# Patient Record
Sex: Female | Born: 1985 | Race: Black or African American | Hispanic: No | Marital: Single | Smoking: Current every day smoker
Health system: Southern US, Community
[De-identification: ages and names within clinical notes are randomized; demographics above are authoritative.]

## PROBLEM LIST (undated history)

## (undated) HISTORY — PX: ABDOMINAL SURGERY: SHX537

---

## 2013-01-26 ENCOUNTER — Encounter (HOSPITAL_BASED_OUTPATIENT_CLINIC_OR_DEPARTMENT_OTHER): Payer: Self-pay

## 2013-01-26 ENCOUNTER — Emergency Department (HOSPITAL_BASED_OUTPATIENT_CLINIC_OR_DEPARTMENT_OTHER)
Admission: EM | Admit: 2013-01-26 | Discharge: 2013-01-27 | Disposition: A | Discharge status: Home / Self Care | Payer: Medicaid Other | Attending: Emergency Medicine | Admitting: Emergency Medicine

## 2013-01-26 DIAGNOSIS — K0520 Aggressive periodontitis, unspecified: Secondary | ICD-10-CM | POA: Insufficient documentation

## 2013-01-26 DIAGNOSIS — F17200 Nicotine dependence, unspecified, uncomplicated: Secondary | ICD-10-CM | POA: Insufficient documentation

## 2013-01-26 DIAGNOSIS — K05219 Aggressive periodontitis, localized, unspecified severity: Secondary | ICD-10-CM

## 2013-01-26 DIAGNOSIS — K0889 Other specified disorders of teeth and supporting structures: Secondary | ICD-10-CM

## 2013-01-26 DIAGNOSIS — K089 Disorder of teeth and supporting structures, unspecified: Secondary | ICD-10-CM | POA: Insufficient documentation

## 2013-01-26 MED ORDER — HYDROCODONE-ACETAMINOPHEN 5-325 MG PO TABS: 2.0000 | ORAL_TABLET | Freq: Four times a day (QID) | ORAL | Status: DC | PRN

## 2013-01-26 MED ORDER — HYDROCODONE-ACETAMINOPHEN 5-325 MG PO TABS
2.0000 | ORAL_TABLET | Freq: Once | ORAL | Status: AC
  Administered 2013-01-26: 2 via ORAL
  Filled 2013-01-26: qty 2

## 2013-01-26 MED ORDER — PENICILLIN V POTASSIUM 500 MG PO TABS: 500.0000 mg | ORAL_TABLET | Freq: Four times a day (QID) | ORAL | Status: DC

## 2013-01-26 MED ORDER — BUPIVACAINE-EPINEPHRINE PF 0.5-1:200000 % IJ SOLN
1.8000 mL | Freq: Once | INTRAMUSCULAR | Status: DC
  Filled 2013-01-26 (×2): qty 1.8

## 2013-01-26 NOTE — ED Notes (Signed)
Patient reports that she developed dental pain 1 hour pta. Swelling to right jaw and right lower gum.

## 2013-01-26 NOTE — ED Provider Notes (Signed)
History     CSN: [id number]  Arrival date & time 01/26/13  2309   First MD Initiated Contact with Patient 01/26/13 2322      Chief Complaint  Patient presents with  . Dental Pain    (Consider location/radiation/quality/duration/timing/severity/associated sxs/prior treatment) HPI Comments: Patient presents to the emergency department with a dental complaint. Symptoms began 1 hour ago. The patient has tried to alleviate pain with nothing.  Pain rated at a 10/10, characterized as throbbing in nature and located right lower molar. Patient denies fever, night sweats, chills, difficulty swallowing or opening mouth, SOB, nuchal rigidity or decreased ROM of neck.  Patient does not have a dentist and requests a resource guide at discharge.   The history is provided by the patient. No language interpreter was used.    History reviewed. No pertinent past medical history.  History reviewed. No pertinent past surgical history.  No family history on file.  History  Substance Use Topics  . Smoking status: Current Every Day Smoker -- 0.50 packs/day    Types: Cigarettes  . Smokeless tobacco: Not on file  . Alcohol Use: No    OB History   Grav Para Term Preterm Abortions TAB SAB Ect Mult Living                  Review of Systems  All other systems reviewed and are negative.    Allergies  Review of patient's allergies indicates no known allergies.  Home Medications   Current Outpatient Rx  Name  Route  Sig  Dispense  Refill  . HYDROcodone-acetaminophen (NORCO/VICODIN) 5-325 MG per tablet   Oral   Take 2 tablets by mouth every 6 (six) hours as needed for pain.   15 tablet   0   . penicillin v potassium (VEETID) 500 MG tablet   Oral   Take 1 tablet (500 mg total) by mouth 4 (four) times daily.   40 tablet   0     BP 124/84  Temp(Src) 98 F (36.7 C) (Oral)  Ht 5\' 7"  (1.702 m)  Wt 162 lb (73.483 kg)  BMI 25.37 kg/m2  SpO2 98%  Physical Exam  Nursing note and vitals  reviewed. Constitutional: She is oriented to person, place, and time. She appears well-developed and well-nourished.  HENT:  Head: Normocephalic and atraumatic.  Mouth/Throat:    Poor dentition throughout.  Affected tooth as diagrammed.  No signs of peritonsillar or tonsillar abscess.  Oropharynx is clear and without exudates.  Uvula is midline.  Airway is intact. No signs of Ludwig's angina.  1x2cm gingival abscess   Eyes: Conjunctivae and EOM are normal.  Neck: Normal range of motion.  Cardiovascular: Normal rate.   Pulmonary/Chest: Effort normal.  Abdominal: She exhibits no distension.  Musculoskeletal: Normal range of motion.  Neurological: She is alert and oriented to person, place, and time.  Skin: Skin is dry.  Psychiatric: She has a normal mood and affect. Her behavior is normal. Judgment and thought content normal.    ED Course  Procedures (including critical care time)  Labs Reviewed - No data to display No results found. INCISION AND DRAINAGE Performed by: Roxy Horseman Consent: Verbal consent obtained. Risks and benefits: risks, benefits and alternatives were discussed Type: abscess  Body area: right lower gingiva  Anesthesia: local infiltration  Incision was made with a scalpel.  Local anesthetic: marcaine  Anesthetic total: 1.8 ml  Complexity: complex Blunt dissection to break up loculations  Drainage: purulent  Drainage amount:  3ml  Packing material: none  Patient tolerance: Patient tolerated the procedure well with no immediate complications.     1. Gingival abscess   2. Pain, dental       MDM  Patient with dental pain and gingival abscess. Gingival abscess was drained while performing dental block. Will discharge the patient with pain medicine and penicillin. No other complications. Dental followup. Patient is stable and ready for discharge.        Roxy Horseman, PA-C 01/26/13 [phone]

## 2013-01-27 NOTE — ED Provider Notes (Signed)
Medical screening examination/treatment/procedure(s) were performed by non-physician practitioner and as supervising physician I was immediately available for consultation/collaboration.   Carlisle Beers Nareg Breighner, MD 01/27/13 0700

## 2020-01-27 ENCOUNTER — Other Ambulatory Visit: Payer: Self-pay

## 2020-01-27 ENCOUNTER — Encounter (HOSPITAL_BASED_OUTPATIENT_CLINIC_OR_DEPARTMENT_OTHER): Payer: Self-pay | Admitting: Emergency Medicine

## 2020-01-27 ENCOUNTER — Emergency Department (HOSPITAL_BASED_OUTPATIENT_CLINIC_OR_DEPARTMENT_OTHER)
Admission: EM | Admit: 2020-01-27 | Discharge: 2020-01-27 | Disposition: A | Discharge status: Home / Self Care | Payer: Medicaid Other | Attending: Emergency Medicine | Admitting: Emergency Medicine

## 2020-01-27 DIAGNOSIS — F17210 Nicotine dependence, cigarettes, uncomplicated: Secondary | ICD-10-CM | POA: Insufficient documentation

## 2020-01-27 DIAGNOSIS — A599 Trichomoniasis, unspecified: Secondary | ICD-10-CM | POA: Diagnosis not present

## 2020-01-27 DIAGNOSIS — M545 Low back pain, unspecified: Secondary | ICD-10-CM

## 2020-01-27 LAB — URINALYSIS, ROUTINE W REFLEX MICROSCOPIC
Bilirubin Urine: NEGATIVE
Glucose, UA: NEGATIVE mg/dL
Ketones, ur: NEGATIVE mg/dL
Nitrite: NEGATIVE
Protein, ur: NEGATIVE mg/dL
Specific Gravity, Urine: 1.02 (ref 1.005–1.030)
pH: 6 (ref 5.0–8.0)

## 2020-01-27 LAB — URINALYSIS, MICROSCOPIC (REFLEX)

## 2020-01-27 LAB — PREGNANCY, URINE: Preg Test, Ur: NEGATIVE

## 2020-01-27 MED ORDER — IBUPROFEN 800 MG PO TABS: 800.0000 mg | ORAL_TABLET | Freq: Three times a day (TID) | ORAL | 0 refills | Status: DC | PRN

## 2020-01-27 MED ORDER — METHOCARBAMOL 500 MG PO TABS: 500.0000 mg | ORAL_TABLET | Freq: Three times a day (TID) | ORAL | 0 refills | Status: DC | PRN

## 2020-01-27 MED ORDER — METRONIDAZOLE 500 MG PO TABS: 500.0000 mg | ORAL_TABLET | Freq: Two times a day (BID) | ORAL | 0 refills | Status: DC

## 2020-01-27 MED FILL — IBUPROFEN 800 MG TAB: 800 | 7 days supply | Qty: 21 | Fill #0

## 2020-01-27 MED FILL — METHOCARBAMOL 500 MG TABS: 500 | 5 days supply | Qty: 15 | Fill #0

## 2020-01-27 MED FILL — METRONIDAZOLE 500 MG TABS: 500 | 7 days supply | Qty: 14 | Fill #0

## 2020-01-27 NOTE — Discharge Instructions (Addendum)
You are seen in the emergency department for low back pain.  You had a urinalysis and pregnancy test that were unremarkable.  We are prescribing you ibuprofen and a muscle relaxant to help with your symptoms.  You can also try heating pad to the area.  Please follow-up with your doctor for further evaluation.  Return to the emergency department for any worsening or concerning symptoms

## 2020-01-27 NOTE — ED Provider Notes (Signed)
[facility] EMERGENCY DEPARTMENT Provider Note   CSN: [id number] Arrival date & time: 01/27/20  9509     History Chief Complaint  Patient presents with  . Back Pain    Tara Salazar is a 34 y.o. female.  She is complaining of low back pain that started last night.  It is across to her belt line on both sides.  No radiation down the legs or into the abdomen.  No urinary symptoms.  No numbness or weakness.  Denies any trauma.  Took some Tylenol without any improvement.  The history is provided by the patient.  Back Pain Location:  Lumbar spine Quality:  Aching Radiates to:  Does not radiate Pain severity:  Moderate Pain is:  Unable to specify Onset quality:  Gradual Duration:  2 days Timing:  Constant Progression:  Unchanged Chronicity:  New Context: not falling and not recent injury   Relieved by:  None tried Worsened by:  Bending and twisting Ineffective treatments:  OTC medications Associated symptoms: no abdominal pain, no bladder incontinence, no bowel incontinence, no chest pain, no dysuria, no fever, no leg pain, no numbness, no paresthesias and no weakness   Risk factors: no hx of cancer, no recent surgery, no steroid use and no vascular disease        History reviewed. No pertinent past medical history.  There are no problems to display for this patient.   Past Surgical History:  Procedure Laterality Date  . CESAREAN SECTION       OB History   No obstetric history on file.     History reviewed. No pertinent family history.  Social History   Tobacco Use  . Smoking status: Current Every Day Smoker    Packs/day: 0.50    Types: Cigarettes  Substance Use Topics  . Alcohol use: Not Currently  . Drug use: Never    Home Medications Prior to Admission medications   Medication Sig Start Date End Date Taking? Authorizing Provider  HYDROcodone-acetaminophen (NORCO/VICODIN) 5-325 MG per tablet Take 2 tablets by mouth every 6 (six) hours as  needed for pain. 01/26/13   Roxy Horseman, PA-C  penicillin v potassium (VEETID) 500 MG tablet Take 1 tablet (500 mg total) by mouth 4 (four) times daily. 01/26/13   Roxy Horseman, PA-C    Allergies    Patient has no known allergies.  Review of Systems   Review of Systems  Constitutional: Negative for fever.  HENT: Negative for sore throat.   Eyes: Negative for visual disturbance.  Respiratory: Negative for shortness of breath.   Cardiovascular: Negative for chest pain.  Gastrointestinal: Negative for abdominal pain and bowel incontinence.  Genitourinary: Negative for bladder incontinence and dysuria.  Musculoskeletal: Positive for back pain.  Skin: Negative for rash.  Neurological: Negative for weakness, numbness and paresthesias.    Physical Exam Updated Vital Signs BP 130/80 (BP Location: Right Arm)   Pulse 65   Temp 98.6 F (37 C) (Oral)   Resp 16   Ht 5\' 7"  (1.702 m)   Wt 102.1 kg   LMP 12/18/2019 (Approximate)   SpO2 100%   BMI 35.24 kg/m   Physical Exam Vitals and nursing note reviewed.  Constitutional:      General: She is not in acute distress.    Appearance: She is well-developed.  HENT:     Head: Normocephalic and atraumatic.  Eyes:     Conjunctiva/sclera: Conjunctivae normal.  Cardiovascular:     Rate and Rhythm: Normal rate and regular  rhythm.     Heart sounds: No murmur.  Pulmonary:     Effort: Pulmonary effort is normal. No respiratory distress.     Breath sounds: Normal breath sounds.  Abdominal:     Palpations: Abdomen is soft.     Tenderness: There is no abdominal tenderness.  Musculoskeletal:        General: Tenderness present. No deformity. Normal range of motion.     Cervical back: Neck supple.     Comments: She is nontender cervical and thoracic spine.  There are some vague lumbar tenderness but more so paralumbar.  Increased pain with twisting and bending.  Skin:    General: Skin is warm and dry.     Capillary Refill: Capillary  refill takes less than 2 seconds.  Neurological:     General: No focal deficit present.     Mental Status: She is alert.     Sensory: No sensory deficit.     Motor: No weakness.     Gait: Gait normal.     ED Results / Procedures / Treatments   Labs (all labs ordered are listed, but only abnormal results are displayed) Labs Reviewed  URINALYSIS, ROUTINE W REFLEX MICROSCOPIC - Abnormal; Notable for the following components:      Result Value   APPearance CLOUDY (*)    Hgb urine dipstick TRACE (*)    Leukocytes,Ua SMALL (*)    All other components within normal limits  URINALYSIS, MICROSCOPIC (REFLEX) - Abnormal; Notable for the following components:   Bacteria, UA FEW (*)    Trichomonas, UA PRESENT (*)    All other components within normal limits  PREGNANCY, URINE    EKG None  Radiology No results found.  Procedures Procedures (including critical care time)  Medications Ordered in ED Medications - No data to display  ED Course  I have reviewed the triage vital signs and the nursing notes.  Pertinent labs & imaging results that were available during my care of the patient were reviewed by me and considered in my medical decision making (see chart for details).  Clinical Course as of Jan 26 1746  Tue Jan 27, 2020  1021 Differential diagnosis includes musculoskeletal pain, radiculopathy, spinal stenosis, UTI, renal colic   [MB]    Clinical Course User Index [MB] Terrilee Files, MD   MDM Rules/Calculators/A&P                     34 year old female here with 1 day of low back pain.  No red flags.  Normal neuro exam.  Differential includes musculoskeletal, fracture, UTI, vascular.  Benign exam and normal vitals.  Urinalysis here does not look particularly infected but does show signs of trichomonas.  Reviewed with patient.  Will put on antibiotics and treat for musculoskeletal back pain.  Return instructions discussed.  Final Clinical Impression(s) / ED  Diagnoses Final diagnoses:  Acute bilateral low back pain without sciatica  Trichimoniasis    Rx / DC Orders ED Discharge Orders         Ordered    ibuprofen (ADVIL) 800 MG tablet  Every 8 hours PRN,   Status:  Discontinued     01/27/20 1035    methocarbamol (ROBAXIN) 500 MG tablet  Every 8 hours PRN     01/27/20 1035    ibuprofen (ADVIL) 800 MG tablet  Every 8 hours PRN     01/27/20 1036    metroNIDAZOLE (FLAGYL) 500 MG tablet  2 times daily  01/27/20 1130           Hayden Rasmussen, MD 01/27/20 [phone]

## 2020-01-27 NOTE — ED Triage Notes (Signed)
Pt c/o lower back pain that pt reports started last night. Pt denies dysuria, denies injury. Pt reports numbness in left leg last night, denies numbness or tingling at this time. LMP 12/18/2019

## 2020-11-02 ENCOUNTER — Other Ambulatory Visit: Payer: Self-pay

## 2020-11-02 ENCOUNTER — Encounter (HOSPITAL_BASED_OUTPATIENT_CLINIC_OR_DEPARTMENT_OTHER): Payer: Self-pay

## 2020-11-02 ENCOUNTER — Emergency Department (HOSPITAL_BASED_OUTPATIENT_CLINIC_OR_DEPARTMENT_OTHER)
Admission: EM | Admit: 2020-11-02 | Discharge: 2020-11-02 | Disposition: A | Discharge status: Home / Self Care | Payer: Medicaid Other | Attending: Emergency Medicine | Admitting: Emergency Medicine

## 2020-11-02 DIAGNOSIS — J111 Influenza due to unidentified influenza virus with other respiratory manifestations: Secondary | ICD-10-CM

## 2020-11-02 DIAGNOSIS — R519 Headache, unspecified: Secondary | ICD-10-CM | POA: Diagnosis present

## 2020-11-02 DIAGNOSIS — M5441 Lumbago with sciatica, right side: Secondary | ICD-10-CM | POA: Diagnosis not present

## 2020-11-02 DIAGNOSIS — U071 COVID-19: Secondary | ICD-10-CM | POA: Diagnosis not present

## 2020-11-02 DIAGNOSIS — F17210 Nicotine dependence, cigarettes, uncomplicated: Secondary | ICD-10-CM | POA: Insufficient documentation

## 2020-11-02 DIAGNOSIS — M5442 Lumbago with sciatica, left side: Secondary | ICD-10-CM | POA: Insufficient documentation

## 2020-11-02 LAB — URINALYSIS, ROUTINE W REFLEX MICROSCOPIC
Bilirubin Urine: NEGATIVE
Glucose, UA: NEGATIVE mg/dL
Ketones, ur: NEGATIVE mg/dL
Nitrite: NEGATIVE
Protein, ur: NEGATIVE mg/dL
Specific Gravity, Urine: 1.005 (ref 1.005–1.030)
pH: 7 (ref 5.0–8.0)

## 2020-11-02 LAB — COMPREHENSIVE METABOLIC PANEL
ALT: 17 U/L (ref 0–44)
AST: 17 U/L (ref 15–41)
Albumin: 4 g/dL (ref 3.5–5.0)
Alkaline Phosphatase: 84 U/L (ref 38–126)
Anion gap: 10 (ref 5–15)
BUN: 11 mg/dL (ref 6–20)
CO2: 23 mmol/L (ref 22–32)
Calcium: 9.2 mg/dL (ref 8.9–10.3)
Chloride: 103 mmol/L (ref 98–111)
Creatinine, Ser: 1.22 mg/dL — ABNORMAL HIGH (ref 0.44–1.00)
GFR, Estimated: 60 mL/min — ABNORMAL LOW (ref >60)
Glucose, Bld: 100 mg/dL — ABNORMAL HIGH (ref 70–99)
Potassium: 3.5 mmol/L (ref 3.5–5.1)
Sodium: 136 mmol/L (ref 135–145)
Total Bilirubin: 0.8 mg/dL (ref 0.3–1.2)
Total Protein: 8.5 g/dL — ABNORMAL HIGH (ref 6.5–8.1)

## 2020-11-02 LAB — CBC WITH DIFFERENTIAL/PLATELET
Abs Immature Granulocytes: 0.03 10*3/uL (ref 0.00–0.07)
Basophils Absolute: 0 10*3/uL (ref 0.0–0.1)
Basophils Relative: 0 %
Eosinophils Absolute: 0 10*3/uL (ref 0.0–0.5)
Eosinophils Relative: 0 %
HCT: 33.7 % — ABNORMAL LOW (ref 36.0–46.0)
Hemoglobin: 11.4 g/dL — ABNORMAL LOW (ref 12.0–15.0)
Immature Granulocytes: 0 %
Lymphocytes Relative: 30 %
Lymphs Abs: 3.3 10*3/uL (ref 0.7–4.0)
MCH: 28.6 pg (ref 26.0–34.0)
MCHC: 33.8 g/dL (ref 30.0–36.0)
MCV: 84.7 fL (ref 80.0–100.0)
Monocytes Absolute: 0.7 10*3/uL (ref 0.1–1.0)
Monocytes Relative: 6 %
Neutro Abs: 7.1 10*3/uL (ref 1.7–7.7)
Neutrophils Relative %: 64 %
Platelets: 338 10*3/uL (ref 150–400)
RBC: 3.98 MIL/uL (ref 3.87–5.11)
RDW: 14.5 % (ref 11.5–15.5)
WBC: 11.1 10*3/uL — ABNORMAL HIGH (ref 4.0–10.5)
nRBC: 0 % (ref 0.0–0.2)

## 2020-11-02 LAB — SARS CORONAVIRUS 2 (TAT 6-24 HRS): SARS Coronavirus 2: POSITIVE — AB

## 2020-11-02 LAB — PREGNANCY, URINE: Preg Test, Ur: NEGATIVE

## 2020-11-02 LAB — URINALYSIS, MICROSCOPIC (REFLEX)

## 2020-11-02 MED ORDER — ACETAMINOPHEN 500 MG PO TABS
1000.0000 mg | ORAL_TABLET | Freq: Once | ORAL | Status: AC
  Administered 2020-11-02: 1000 mg via ORAL
  Filled 2020-11-02: qty 2

## 2020-11-02 MED ORDER — KETOROLAC TROMETHAMINE 30 MG/ML IJ SOLN
60.0000 mg | Freq: Once | INTRAMUSCULAR | Status: AC
  Administered 2020-11-02: 60 mg via INTRAMUSCULAR
  Filled 2020-11-02: qty 2

## 2020-11-02 NOTE — ED Triage Notes (Signed)
Pt arrives with c/o body aches, fever, and chills starting yesterday. Pt is not vaccinated, denies any GI or respiratory symptoms at this time.

## 2020-11-02 NOTE — ED Provider Notes (Signed)
[facility] EMERGENCY DEPARTMENT Provider Note   CSN: [id number] Arrival date & time: 11/02/20  1018     History Chief Complaint  Patient presents with   Generalized Body Aches    Tara Salazar is a 35 y.o. female.   Patient developed a headache yesterday followed by back and leg pain.  She has a "sharp pain" going down her left leg.  It has become worse since that time.  Now both legs hurt.  She has had feelings of chills but no fever.  No nausea/vomiting/diarrhea or abdominal pain.  No cough no dizziness.  She rates the headache as a 10/10.  She has not been near any sick contacts.  No Covid exposure.  She is currently unvaccinated against Covid or flu.  She denies dysuria.  She took Tylenol but this did not help her headache.  She has not had any falls or injury.  She does have a history of musculoskeletal leg and back pain.        History reviewed. No pertinent past medical history.  There are no problems to display for this patient.   Past Surgical History:  Procedure Laterality Date   CESAREAN SECTION       OB History   No obstetric history on file.     No family history on file.  Social History   Tobacco Use   Smoking status: Current Every Day Smoker    Packs/day: 0.50    Types: Cigarettes   Smokeless tobacco: Never Used  Substance Use Topics   Alcohol use: Not Currently   Drug use: Never    Home Medications Prior to Admission medications   Medication Sig Start Date End Date Taking? Authorizing Provider  HYDROcodone-acetaminophen (NORCO/VICODIN) 5-325 MG per tablet Take 2 tablets by mouth every 6 (six) hours as needed for pain. 01/26/13   Roxy Horseman, PA-C  ibuprofen (ADVIL) 800 MG tablet Take 1 tablet (800 mg total) by mouth every 8 (eight) hours as needed. 01/27/20   Terrilee Files, MD  methocarbamol (ROBAXIN) 500 MG tablet Take 1 tablet (500 mg total) by mouth every 8 (eight) hours as needed for muscle spasms. 01/27/20   Terrilee Files, MD  metroNIDAZOLE (FLAGYL) 500 MG tablet Take 1 tablet (500 mg total) by mouth 2 (two) times daily. 01/27/20   Terrilee Files, MD  molnupiravir EUA 200 mg CAPS Take 4 capsules (800 mg total) by mouth 2 (two) times daily for 5 days. 11/03/20 11/08/20  Blanchard Kelch, NP  penicillin v potassium (VEETID) 500 MG tablet Take 1 tablet (500 mg total) by mouth 4 (four) times daily. 01/26/13   Roxy Horseman, PA-C    Allergies    Patient has no known allergies.  Review of Systems   Review of Systems  All other systems reviewed and are negative.   Physical Exam Updated Vital Signs BP 112/63 (BP Location: Right Arm)    Pulse 89    Temp 99.4 F (37.4 C) (Oral)    Resp 16    Ht 5\' 7"  (1.702 m)    Wt 72.6 kg    SpO2 99%    BMI 25.06 kg/m   Physical Exam Vitals reviewed.  Constitutional:      General: She is in acute distress.  HENT:     Head: Normocephalic.     Nose: Nose normal. No congestion.     Mouth/Throat:     Mouth: Mucous membranes are moist.     Pharynx: Oropharynx  is clear.  Eyes:     Conjunctiva/sclera: Conjunctivae normal.     Pupils: Pupils are equal, round, and reactive to light.  Cardiovascular:     Rate and Rhythm: Normal rate and regular rhythm.     Pulses: Normal pulses.     Heart sounds: Normal heart sounds. No murmur heard.   Pulmonary:     Effort: Pulmonary effort is normal. No respiratory distress.     Breath sounds: Normal breath sounds. No wheezing.  Abdominal:     General: Abdomen is flat.     Palpations: Abdomen is soft.  Musculoskeletal:        General: Tenderness present. No swelling.     Cervical back: No rigidity or tenderness.     Comments: Positive straight leg test on the left side.  Neurological:     Mental Status: She is alert.     ED Results / Procedures / Treatments   Labs (all labs ordered are listed, but only abnormal results are displayed) Labs Reviewed  SARS CORONAVIRUS 2 (TAT 6-24 HRS) - Abnormal; Notable for the  following components:      Result Value   SARS Coronavirus 2 POSITIVE (*)    All other components within normal limits  COMPREHENSIVE METABOLIC PANEL - Abnormal; Notable for the following components:   Glucose, Bld 100 (*)    Creatinine, Ser 1.22 (*)    Total Protein 8.5 (*)    GFR, Estimated 60 (*)    All other components within normal limits  CBC WITH DIFFERENTIAL/PLATELET - Abnormal; Notable for the following components:   WBC 11.1 (*)    Hemoglobin 11.4 (*)    HCT 33.7 (*)    All other components within normal limits  URINALYSIS, ROUTINE W REFLEX MICROSCOPIC - Abnormal; Notable for the following components:   Hgb urine dipstick SMALL (*)    Leukocytes,Ua TRACE (*)    All other components within normal limits  URINALYSIS, MICROSCOPIC (REFLEX) - Abnormal; Notable for the following components:   Bacteria, UA RARE (*)    All other components within normal limits  PREGNANCY, URINE    EKG None  Radiology No results found.  Procedures Procedures  Medications Ordered in ED Medications  acetaminophen (TYLENOL) tablet 1,000 mg (1,000 mg Oral Given 11/02/20 1143)  ketorolac (TORADOL) 30 MG/ML injection 60 mg (60 mg Intramuscular Given 11/02/20 1233)    ED Course  I have reviewed the triage vital signs and the nursing notes.  Pertinent labs & imaging results that were available during my care of the patient were reviewed by me and considered in my medical decision making (see chart for details).    MDM Rules/Calculators/A&P                          Patient presents with headache and back/ leg pain consistent with sciatica.  She has had a ED visit within the past year for low back pain.  Headache/chills more consistent with a possible viral infection such as Covid.  This may also be contributing to her low back and leg pain.  White count was very mildly elevated and CMP showed only mildly elevated creatinine.  Gave patient a shot of Toradol.  Advised her to use heat, Tylenol and  Aleve as needed for pain.  Advised her to follow-up with PCP.  Covid test obtained and pending. Final Clinical Impression(s) / ED Diagnoses Final diagnoses:  Influenza-like illness  Acute left-sided low back pain with  bilateral sciatica    Rx / DC Orders ED Discharge Orders    None       Sandre Kitty, MD 11/03/20 1733    Melene Plan, DO 11/04/20 [phone]

## 2020-11-02 NOTE — ED Notes (Signed)
Sharp pain in back and left leg started yesterday denies dysuria

## 2020-11-02 NOTE — Discharge Instructions (Addendum)
Your leg pain is likely related to your back pain and a pinched nerve.  This is called sciatica.  You should follow up with your primary care doctor regarding this.  You can also take tylenol 1000mg  three times a day and alleve twice a day.    You are also showing signs of a viral infection which may be due to covid. We will follow up with the results of the covid test when we get them.

## 2020-11-03 ENCOUNTER — Telehealth: Payer: Self-pay | Admitting: Infectious Diseases

## 2020-11-03 ENCOUNTER — Other Ambulatory Visit (HOSPITAL_COMMUNITY): Payer: Self-pay | Admitting: Infectious Diseases

## 2020-11-03 MED ORDER — MOLNUPIRAVIR EUA 200MG CAPSULE: 4.0000 | ORAL_CAPSULE | Freq: Two times a day (BID) | ORAL | 0 refills | Status: AC

## 2020-11-03 MED FILL — MOLNUPIRAVIR 200 MG CAPS: 200 | 5 days supply | Qty: 40 | Fill #0

## 2020-11-03 NOTE — Telephone Encounter (Signed)
Outpatient Oral COVID Treatment Note  I connected with Tara Salazar on 11/03/2020/11:32 AM by telephone and verified that I am speaking with the correct person using two identifiers.  I discussed the limitations, risks, security, and privacy concerns of performing an evaluation and management service by telephone and the availability of in person appointments. I also discussed with the patient that there may be a patient responsible charge related to this service. The patient expressed understanding and agreed to proceed.  Patient location: [place] [facility]  Provider location: [facility]   Diagnosis: COVID-19 infection  Purpose of visit: Discussion of potential use of Molnupiravir or Paxlovid, a new treatment for mild to moderate COVID-19 viral infection in non-hospitalized patients.   Subjective: Patient is a 35 y.o. female who has been diagnosed with COVID 19 viral infection.  Their symptoms began on 11/01/2020 with body aches, URI sx, back pain .    PMHx: overweight, BMI 25, unvaccinated for COVID   No Known Allergies   Current Outpatient Medications:  .  HYDROcodone-acetaminophen (NORCO/VICODIN) 5-325 MG per tablet, Take 2 tablets by mouth every 6 (six) hours as needed for pain., Disp: 15 tablet, Rfl: 0 .  ibuprofen (ADVIL) 800 MG tablet, Take 1 tablet (800 mg total) by mouth every 8 (eight) hours as needed., Disp: 21 tablet, Rfl: 0 .  methocarbamol (ROBAXIN) 500 MG tablet, Take 1 tablet (500 mg total) by mouth every 8 (eight) hours as needed for muscle spasms., Disp: 15 tablet, Rfl: 0 .  metroNIDAZOLE (FLAGYL) 500 MG tablet, Take 1 tablet (500 mg total) by mouth 2 (two) times daily., Disp: 14 tablet, Rfl: 0 .  penicillin v potassium (VEETID) 500 MG tablet, Take 1 tablet (500 mg total) by mouth 4 (four) times daily., Disp: 40 tablet, Rfl: 0  Objective: Patient appears/sounds ill and uncomfortable.  They are in no apparent distress.  Breathing is non labored.  Mood and behavior are normal.    Laboratory Data:  Recent Results (from the past 2160 hour(s))  Comprehensive metabolic panel     Status: Abnormal   Collection Time: 11/02/20 11:13 AM  Result Value Ref Range   Sodium 136 135 - 145 mmol/L   Potassium 3.5 3.5 - 5.1 mmol/L   Chloride 103 98 - 111 mmol/L   CO2 23 22 - 32 mmol/L   Glucose, Bld 100 (H) 70 - 99 mg/dL    Comment: Glucose reference range applies only to samples taken after fasting for at least 8 hours.   BUN 11 6 - 20 mg/dL   Creatinine, Ser 5.85 (H) 0.44 - 1.00 mg/dL   Calcium 9.2 8.9 - 27.7 mg/dL   Total Protein 8.5 (H) 6.5 - 8.1 g/dL   Albumin 4.0 3.5 - 5.0 g/dL   AST 17 15 - 41 U/L   ALT 17 0 - 44 U/L   Alkaline Phosphatase 84 38 - 126 U/L   Total Bilirubin 0.8 0.3 - 1.2 mg/dL   GFR, Estimated 60 (L) >60 mL/min    Comment: (NOTE) Calculated using the CKD-EPI Creatinine Equation (2021)    Anion gap 10 5 - 15    Comment: Performed at [facility], [address] [place], [place] [address]  CBC with Differential     Status: Abnormal   Collection Time: 11/02/20 11:13 AM  Result Value Ref Range   WBC 11.1 (H) 4.0 - 10.5 K/uL   RBC 3.98 3.87 - 5.11 MIL/uL   Hemoglobin 11.4 (L) 12.0 - 15.0 g/dL  HCT 33.7 (L) 36.0 - 46.0 %   MCV 84.7 80.0 - 100.0 fL   MCH 28.6 26.0 - 34.0 pg   MCHC 33.8 30.0 - 36.0 g/dL   RDW 41.9 62.2 - 29.7 %   Platelets 338 150 - 400 K/uL   nRBC 0.0 0.0 - 0.2 %   Neutrophils Relative % 64 %   Neutro Abs 7.1 1.7 - 7.7 K/uL   Lymphocytes Relative 30 %   Lymphs Abs 3.3 0.7 - 4.0 K/uL   Monocytes Relative 6 %   Monocytes Absolute 0.7 0.1 - 1.0 K/uL   Eosinophils Relative 0 %   Eosinophils Absolute 0.0 0.0 - 0.5 K/uL   Basophils Relative 0 %   Basophils Absolute 0.0 0.0 - 0.1 K/uL   Immature Granulocytes 0 %   Abs Immature Granulocytes 0.03 0.00 - 0.07 K/uL    Comment: Performed at [facility], [address] [place], [place] [address]  SARS CORONAVIRUS 2 (TAT 6-24 HRS) Nasopharyngeal Nasopharyngeal  Swab     Status: Abnormal   Collection Time: 11/02/20 11:13 AM   Specimen: Nasopharyngeal Swab  Result Value Ref Range   SARS Coronavirus 2 POSITIVE (A) NEGATIVE    Comment: (NOTE) SARS-CoV-2 target nucleic acids are DETECTED.  The SARS-CoV-2 RNA is generally detectable in upper and lower respiratory specimens during the acute phase of infection. Positive results are indicative of the presence of SARS-CoV-2 RNA. Clinical correlation with patient history and other diagnostic information is  necessary to determine patient infection status. Positive results do not rule out bacterial infection or co-infection with other viruses.  The expected result is Negative.  Fact Sheet for Patients: HairSlick.no  Fact Sheet for Healthcare Providers: quierodirigir.com  This test is not yet approved or cleared by the [place] FDA and  has been authorized for detection and/or diagnosis of SARS-CoV-2 by FDA under an Emergency Use Authorization (EUA). This EUA will remain  in effect (meaning this test can be used) for the duration of the COVID-19 declaration under Section 564(b)(1) of the Act, 21 U. S.C. section 360bbb-3(b)(1), unless the authorization is terminated or revoked sooner.   Performed at [facility], [address], [place], [place] [address]   Urinalysis, Routine w reflex microscopic Urine, Clean Catch     Status: Abnormal   Collection Time: 11/02/20 11:13 AM  Result Value Ref Range   Color, Urine YELLOW YELLOW   APPearance CLEAR CLEAR   Specific Gravity, Urine 1.005 1.005 - 1.030   pH 7.0 5.0 - 8.0   Glucose, UA NEGATIVE NEGATIVE mg/dL   Hgb urine dipstick SMALL (A) NEGATIVE   Bilirubin Urine NEGATIVE NEGATIVE   Ketones, ur NEGATIVE NEGATIVE mg/dL   Protein, ur NEGATIVE NEGATIVE mg/dL   Nitrite NEGATIVE NEGATIVE   Leukocytes,Ua TRACE (A) NEGATIVE    Comment: Performed at [facility], [address] [place], [place] [address]  Pregnancy, urine     Status: None   Collection Time: 11/02/20 11:13 AM  Result Value Ref Range   Preg Test, Ur NEGATIVE NEGATIVE    Comment:        THE SENSITIVITY OF THIS METHODOLOGY IS >20 mIU/mL. Performed at [facility], [address] [place], [place] [address]   Urinalysis, Microscopic (reflex)     Status: Abnormal   Collection Time: 11/02/20 11:13 AM  Result Value Ref Range   RBC / HPF 0-5 0 - 5 RBC/hpf   WBC,  UA 0-5 0 - 5 WBC/hpf   Bacteria, UA RARE (A) NONE SEEN   Squamous Epithelial / LPF 0-5 0 - 5   Mucus PRESENT     Comment: Performed at [facility], [address], [place], [place] [address]     Assessment: 35 y.o. female with mild/moderate COVID 19 viral infection diagnosed on 2/08 at high risk for progression to severe COVID 19 with SVI risk 4, BMI 25.    Plan:  This patient is a 35 y.o. female that meets the following criteria for Emergency Use Authorization of: Molnupiravir  1. Age >18 yr 2. SARS-COV-2 positive test 3. Symptom onset < 5 days 4. Mild-to-moderate COVID disease with high risk for severe progression to hospitalization or death   I have spoken and communicated the following to the patient or parent/caregiver regarding: 1. Molnupiravir is an unapproved drug that is authorized for use under an TEFL teacher.  2. There are no adequate, approved, available products for the treatment of COVID-19 in adults who have mild-to-moderate COVID-19 and are at high risk for progressing to severe COVID-19, including hospitalization or death. 3. Other therapeutics are currently authorized. For additional information on all products authorized for treatment or prevention of COVID-19, please see https://www.graham-Brunker.com/.  4. There are benefits and risks of taking this treatment as outlined in the "Fact Sheet  for Patients and Caregivers."  5. "Fact Sheet for Patients and Caregivers" was reviewed with patient. A hard copy will be provided to patient from pharmacy prior to the patient receiving treatment. 6. Patients should continue to self-isolate and use infection control measures (e.g., wear mask, isolate, social distance, avoid sharing personal items, clean and disinfect "high touch" surfaces, and frequent handwashing) according to CDC guidelines.  7. The patient or parent/caregiver has the option to accept or refuse treatment. 8. [employer] has established a pregnancy surveillance program. 9. Females of childbearing potential should use a reliable method of contraception correctly and consistently, as applicable, for the duration of treatment and for 4 days after the last dose of Molnupiravir. 10. Males of reproductive potential who are sexually active with females of childbearing potential should use a reliable method of contraception correctly and consistently during treatment and for at least 3 months after the last dose. 11. Pregnancy status and risk was assessed. Patient verbalized understanding of precautions. She had negative pregnancy test in ER yesterday.    After reviewing above information with the patient, the patient agrees to receive molnupiravir.  Follow up instructions:    . Take prescription BID x 5 days as directed . Reach out to pharmacist for counseling on medication if desired . For concerns regarding further COVID symptoms please follow up with your PCP or urgent care . For urgent or life-threatening issues, seek care at your local emergency department  The patient was provided an opportunity to ask questions, and all were answered. The patient agreed with the plan and demonstrated an understanding of the instructions.   Script sent to [facility] and opted to [employer] via mail order (verified patients address for delivery).  The patient was  advised to call their PCP or seek an in-person evaluation if the symptoms worsen or if the condition fails to improve as anticipated.   I provided 9 minutes of non face-to-face telephone visit time during this encounter, and > 50% was spent counseling as documented under my assessment & plan.  Rexene Alberts, NP 11/03/2020 /11:32 AM

## 2020-12-16 ENCOUNTER — Other Ambulatory Visit (HOSPITAL_BASED_OUTPATIENT_CLINIC_OR_DEPARTMENT_OTHER): Payer: Self-pay

## 2021-03-09 ENCOUNTER — Encounter (HOSPITAL_BASED_OUTPATIENT_CLINIC_OR_DEPARTMENT_OTHER): Payer: Self-pay

## 2021-03-09 ENCOUNTER — Emergency Department (HOSPITAL_BASED_OUTPATIENT_CLINIC_OR_DEPARTMENT_OTHER)
Admission: EM | Admit: 2021-03-09 | Discharge: 2021-03-09 | Disposition: A | Discharge status: Home / Self Care | Payer: Medicaid Other | Attending: Emergency Medicine | Admitting: Emergency Medicine

## 2021-03-09 ENCOUNTER — Other Ambulatory Visit: Payer: Self-pay

## 2021-03-09 ENCOUNTER — Other Ambulatory Visit (HOSPITAL_BASED_OUTPATIENT_CLINIC_OR_DEPARTMENT_OTHER): Payer: Self-pay

## 2021-03-09 DIAGNOSIS — R1010 Upper abdominal pain, unspecified: Secondary | ICD-10-CM | POA: Insufficient documentation

## 2021-03-09 DIAGNOSIS — F17210 Nicotine dependence, cigarettes, uncomplicated: Secondary | ICD-10-CM | POA: Diagnosis not present

## 2021-03-09 DIAGNOSIS — R112 Nausea with vomiting, unspecified: Secondary | ICD-10-CM | POA: Insufficient documentation

## 2021-03-09 LAB — CBC WITH DIFFERENTIAL/PLATELET
Abs Immature Granulocytes: 0.01 10*3/uL (ref 0.00–0.07)
Basophils Absolute: 0 10*3/uL (ref 0.0–0.1)
Basophils Relative: 0 %
Eosinophils Absolute: 0.1 10*3/uL (ref 0.0–0.5)
Eosinophils Relative: 1 %
HCT: 35 % — ABNORMAL LOW (ref 36.0–46.0)
Hemoglobin: 11.4 g/dL — ABNORMAL LOW (ref 12.0–15.0)
Immature Granulocytes: 0 %
Lymphocytes Relative: 37 %
Lymphs Abs: 2.5 10*3/uL (ref 0.7–4.0)
MCH: 27.3 pg (ref 26.0–34.0)
MCHC: 32.6 g/dL (ref 30.0–36.0)
MCV: 83.7 fL (ref 80.0–100.0)
Monocytes Absolute: 0.4 10*3/uL (ref 0.1–1.0)
Monocytes Relative: 7 %
Neutro Abs: 3.7 10*3/uL (ref 1.7–7.7)
Neutrophils Relative %: 55 %
Platelets: 353 10*3/uL (ref 150–400)
RBC: 4.18 MIL/uL (ref 3.87–5.11)
RDW: 14.2 % (ref 11.5–15.5)
WBC: 6.8 10*3/uL (ref 4.0–10.5)
nRBC: 0 % (ref 0.0–0.2)

## 2021-03-09 LAB — COMPREHENSIVE METABOLIC PANEL
ALT: 14 U/L (ref 0–44)
AST: 19 U/L (ref 15–41)
Albumin: 4 g/dL (ref 3.5–5.0)
Alkaline Phosphatase: 91 U/L (ref 38–126)
Anion gap: 8 (ref 5–15)
BUN: 10 mg/dL (ref 6–20)
CO2: 22 mmol/L (ref 22–32)
Calcium: 9 mg/dL (ref 8.9–10.3)
Chloride: 107 mmol/L (ref 98–111)
Creatinine, Ser: 0.93 mg/dL (ref 0.44–1.00)
GFR, Estimated: >60 mL/min (ref >60)
Glucose, Bld: 103 mg/dL — ABNORMAL HIGH (ref 70–99)
Potassium: 3.9 mmol/L (ref 3.5–5.1)
Sodium: 137 mmol/L (ref 135–145)
Total Bilirubin: 0.4 mg/dL (ref 0.3–1.2)
Total Protein: 8.3 g/dL — ABNORMAL HIGH (ref 6.5–8.1)

## 2021-03-09 LAB — LIPASE, BLOOD: Lipase: 29 U/L (ref 11–51)

## 2021-03-09 LAB — PREGNANCY, URINE: Preg Test, Ur: NEGATIVE

## 2021-03-09 MED ORDER — FAMOTIDINE 20 MG PO TABS
20.0000 mg | ORAL_TABLET | Freq: Two times a day (BID) | ORAL | 0 refills | Status: AC
  Filled 2021-03-09: qty 10, 5d supply, fill #0

## 2021-03-09 MED ORDER — FAMOTIDINE 20 MG PO TABS
20.0000 mg | ORAL_TABLET | Freq: Once | ORAL | Status: AC
  Administered 2021-03-09: 20 mg via ORAL
  Filled 2021-03-09: qty 1

## 2021-03-09 MED ORDER — ONDANSETRON 4 MG PO TBDP
4.0000 mg | ORAL_TABLET | Freq: Three times a day (TID) | ORAL | 0 refills | Status: DC | PRN
  Filled 2021-03-09: qty 10, 4d supply, fill #0

## 2021-03-09 MED ORDER — ONDANSETRON 4 MG PO TBDP
4.0000 mg | ORAL_TABLET | Freq: Once | ORAL | Status: AC
  Administered 2021-03-09: 4 mg via ORAL
  Filled 2021-03-09: qty 1

## 2021-03-09 NOTE — ED Notes (Signed)
Ginger ale and saltines given for po challenge per provider request.

## 2021-03-09 NOTE — ED Provider Notes (Signed)
[facility] EMERGENCY DEPARTMENT Provider Note   CSN: [id number] Arrival date & time: 03/09/21  1242     History Chief Complaint  Patient presents with   Abdominal Pain    Tara Salazar is a 35 y.o. female.  Patient presents to the emergency department for evaluation of nausea and vomiting as well as upper abdominal pain.  Patient states that she made orange chicken last night for dinner.  About 10 minutes after eating, she developed nausea and vomiting.  No associated diarrhea.  Several episodes of vomiting.  She had an episode upon waking this morning and significant nausea and dry heaving since then.  No blood in the vomit.  Children also had the same food but are not sick.  No chest pain or shortness of breath.  No treatments prior to arrival.  Patient uses Goody powder occasionally.  No alcohol use.      History reviewed. No pertinent past medical history.  There are no problems to display for this patient.   Past Surgical History:  Procedure Laterality Date   CESAREAN SECTION       OB History   No obstetric history on file.     No family history on file.  Social History   Tobacco Use   Smoking status: Every Day    Packs/day: 0.50    Pack years: 0.00    Types: Cigarettes   Smokeless tobacco: Never  Substance Use Topics   Alcohol use: Not Currently   Drug use: Never    Home Medications Prior to Admission medications   Medication Sig Start Date End Date Taking? Authorizing Provider  HYDROcodone-acetaminophen (NORCO/VICODIN) 5-325 MG per tablet Take 2 tablets by mouth every 6 (six) hours as needed for pain. 01/26/13   Roxy Horseman, PA-C  ibuprofen (ADVIL) 800 MG tablet Take 1 tablet (800 mg total) by mouth every 8 (eight) hours as needed. 01/27/20   Terrilee Files, MD  methocarbamol (ROBAXIN) 500 MG tablet Take 1 tablet (500 mg total) by mouth every 8 (eight) hours as needed for muscle spasms. 01/27/20   Terrilee Files, MD  metroNIDAZOLE  (FLAGYL) 500 MG tablet Take 1 tablet (500 mg total) by mouth 2 (two) times daily. 01/27/20   Terrilee Files, MD  Molnupiravir 200 MG CAPS TAKE 4 CAPSULES BY MOUTH 2 TIMES DAILY FOR 5 DAYS 11/03/20 11/03/21  Blanchard Kelch, NP  penicillin v potassium (VEETID) 500 MG tablet Take 1 tablet (500 mg total) by mouth 4 (four) times daily. 01/26/13   Roxy Horseman, PA-C    Allergies    Patient has no known allergies.  Review of Systems   Review of Systems  Constitutional:  Negative for fever.  HENT:  Negative for rhinorrhea and sore throat.   Eyes:  Negative for redness.  Respiratory:  Negative for cough.   Cardiovascular:  Negative for chest pain.  Gastrointestinal:  Positive for abdominal pain, nausea and vomiting. Negative for diarrhea.  Genitourinary:  Negative for dysuria, frequency, hematuria and urgency.  Musculoskeletal:  Negative for myalgias.  Skin:  Negative for rash.  Neurological:  Negative for headaches.   Physical Exam Updated Vital Signs BP 108/85 (BP Location: Left Arm)   Pulse 81   Temp 98.3 F (36.8 C) (Oral)   Resp 20   Ht 5\' 7"  (1.702 m)   Wt 102.1 kg   LMP 02/07/2021 (Exact Date)   SpO2 97%   BMI 35.24 kg/m   Physical Exam Vitals and nursing note  reviewed.  Constitutional:      General: She is not in acute distress.    Appearance: She is well-developed.  HENT:     Head: Normocephalic and atraumatic.     Right Ear: External ear normal.     Left Ear: External ear normal.     Nose: Nose normal.  Eyes:     Conjunctiva/sclera: Conjunctivae normal.  Cardiovascular:     Rate and Rhythm: Normal rate and regular rhythm.     Heart sounds: No murmur heard. Pulmonary:     Effort: No respiratory distress.     Breath sounds: No wheezing, rhonchi or rales.  Abdominal:     Palpations: Abdomen is soft.     Tenderness: There is abdominal tenderness (minimal) in the epigastric area. There is no guarding or rebound.  Musculoskeletal:     Cervical back: Normal range  of motion and neck supple.     Right lower leg: No edema.     Left lower leg: No edema.  Skin:    General: Skin is warm and dry.     Findings: No rash.  Neurological:     General: No focal deficit present.     Mental Status: She is alert. Mental status is at baseline.     Motor: No weakness.  Psychiatric:        Mood and Affect: Mood normal.    ED Results / Procedures / Treatments   Labs (all labs ordered are listed, but only abnormal results are displayed) Labs Reviewed  CBC WITH DIFFERENTIAL/PLATELET - Abnormal; Notable for the following components:      Result Value   Hemoglobin 11.4 (*)    HCT 35.0 (*)    All other components within normal limits  COMPREHENSIVE METABOLIC PANEL - Abnormal; Notable for the following components:   Glucose, Bld 103 (*)    Total Protein 8.3 (*)    All other components within normal limits  PREGNANCY, URINE  LIPASE, BLOOD    EKG None  Radiology No results found.  Procedures Procedures   Medications Ordered in ED Medications  ondansetron (ZOFRAN-ODT) disintegrating tablet 4 mg (has no administration in time range)  famotidine (PEPCID) tablet 20 mg (has no administration in time range)    ED Course  I have reviewed the triage vital signs and the nursing notes.  Pertinent labs & imaging results that were available during my care of the patient were reviewed by me and considered in my medical decision making (see chart for details).  Patient seen and examined. Work-up initiated. Medications ordered.   Vital signs reviewed and are as follows: BP 108/85 (BP Location: Left Arm)   Pulse 81   Temp 98.3 F (36.8 C) (Oral)   Resp 20   Ht 5\' 7"  (1.702 m)   Wt 102.1 kg   LMP 02/07/2021 (Exact Date)   SpO2 97%   BMI 35.24 kg/m   2:58 PM Pt doing well.  She has had ginger ale and water without vomiting.  We reviewed lab work-up which is reassuring.  Plan for discharge home with Zofran and Pepcid.  The patient was urged to return to  the Emergency Department immediately with worsening of current symptoms, worsening abdominal pain, persistent vomiting, blood noted in stools, fever, or any other concerns. The patient verbalized understanding.     MDM Rules/Calculators/A&P                          Patient  with N/V.  Vitals are stable, no fever.  No signs of dehydration, tolerating PO's. Lungs are clear. No focal abdominal pain. Low concern for appendicitis, cholecystitis, pancreatitis, ruptured viscus, UTI, kidney stone, aortic dissection, aortic aneurysm or other emergent abdominal etiology. Supportive therapy indicated with return if symptoms worsen. Patient counseled.  Final Clinical Impression(s) / ED Diagnoses Final diagnoses:  Non-intractable vomiting with nausea, unspecified vomiting type    Rx / DC Orders ED Discharge Orders          Ordered    ondansetron (ZOFRAN ODT) 4 MG disintegrating tablet  Every 8 hours PRN        03/09/21 1454    famotidine (PEPCID) 20 MG tablet  2 times daily        03/09/21 1454             Renne Crigler, PA-C 03/09/21 1459    Virgina Norfolk, DO 03/11/21 [id number]

## 2021-03-09 NOTE — ED Triage Notes (Signed)
Pt c/o abd pain, n/v started ~10pm last night-NAD-steady gait

## 2021-03-09 NOTE — Discharge Instructions (Addendum)
Please read and follow all provided instructions.  Your diagnoses today include:  1. Non-intractable vomiting with nausea, unspecified vomiting type     Tests performed today include: Blood cell counts and platelets Kidney and liver function tests Pancreas function test (called lipase) A blood or urine test for pregnancy (women only) Vital signs. See below for your results today.   Medications prescribed:  Zofran (ondansetron) - for nausea and vomiting  Pepcid (famotidine) - antihistamine  You can find this medication over-the-counter.   DO NOT exceed:  20mg  Pepcid every 12 hours   Take any prescribed medications only as directed.  Home care instructions:  Follow any educational materials contained in this packet.  Your abdominal pain, nausea, vomiting, and diarrhea may be caused by a viral gastroenteritis also called 'stomach flu'. You should rest for the next several days. Keep drinking plenty of fluids and use the medicine for nausea as directed.   Drink clear liquids for the next 24 hours and introduce solid foods slowly after 24 hours using the b.r.a.t. diet (Bananas, Rice, Applesauce, Toast, Yogurt).    Follow-up instructions: Please follow-up with your primary care provider in the next 3 days for further evaluation of your symptoms. If you are not feeling better in 48 hours you may have a condition that is more serious and you need re-evaluation.   Return instructions:  SEEK IMMEDIATE MEDICAL ATTENTION IF: If you have pain that does not go away or becomes severe  A temperature above 101F develops  Repeated vomiting occurs (multiple episodes)  If you have pain that becomes localized to portions of the abdomen. The right side could possibly be appendicitis. In an adult, the left lower portion of the abdomen could be colitis or diverticulitis.  Blood is being passed in stools or vomit (bright red or black tarry stools)  You develop chest pain, difficulty breathing,  dizziness or fainting, or become confused, poorly responsive, or inconsolable (young children) If you have any other emergent concerns regarding your health   Your vital signs today were: BP 120/68 (BP Location: Right Arm)   Pulse 66   Temp 98.3 F (36.8 C) (Oral)   Resp 14   Ht 5\' 7"  (1.702 m)   Wt 102.1 kg   LMP 02/07/2021 (Exact Date)   SpO2 98%   BMI 35.24 kg/m  If your blood pressure (bp) was elevated above 135/85 this visit, please have this repeated by your doctor within one month. --------------

## 2021-03-17 ENCOUNTER — Other Ambulatory Visit (HOSPITAL_BASED_OUTPATIENT_CLINIC_OR_DEPARTMENT_OTHER): Payer: Self-pay

## 2022-01-11 ENCOUNTER — Other Ambulatory Visit: Payer: Self-pay

## 2022-01-11 ENCOUNTER — Emergency Department (HOSPITAL_BASED_OUTPATIENT_CLINIC_OR_DEPARTMENT_OTHER): Payer: Medicaid Other

## 2022-01-11 ENCOUNTER — Encounter (HOSPITAL_BASED_OUTPATIENT_CLINIC_OR_DEPARTMENT_OTHER): Payer: Self-pay

## 2022-01-11 ENCOUNTER — Emergency Department (HOSPITAL_BASED_OUTPATIENT_CLINIC_OR_DEPARTMENT_OTHER)
Admission: EM | Admit: 2022-01-11 | Discharge: 2022-01-11 | Disposition: A | Discharge status: Home / Self Care | Payer: Medicaid Other | Attending: Emergency Medicine | Admitting: Emergency Medicine

## 2022-01-11 DIAGNOSIS — A599 Trichomoniasis, unspecified: Secondary | ICD-10-CM | POA: Diagnosis not present

## 2022-01-11 DIAGNOSIS — D72829 Elevated white blood cell count, unspecified: Secondary | ICD-10-CM | POA: Insufficient documentation

## 2022-01-11 DIAGNOSIS — Z87891 Personal history of nicotine dependence: Secondary | ICD-10-CM | POA: Diagnosis not present

## 2022-01-11 DIAGNOSIS — N730 Acute parametritis and pelvic cellulitis: Secondary | ICD-10-CM

## 2022-01-11 DIAGNOSIS — N739 Female pelvic inflammatory disease, unspecified: Secondary | ICD-10-CM | POA: Diagnosis not present

## 2022-01-11 DIAGNOSIS — R1084 Generalized abdominal pain: Secondary | ICD-10-CM | POA: Diagnosis present

## 2022-01-11 DIAGNOSIS — R1030 Lower abdominal pain, unspecified: Secondary | ICD-10-CM

## 2022-01-11 LAB — CBC WITH DIFFERENTIAL/PLATELET
Abs Immature Granulocytes: 0.04 10*3/uL (ref 0.00–0.07)
Basophils Absolute: 0 10*3/uL (ref 0.0–0.1)
Basophils Relative: 0 %
Eosinophils Absolute: 0.1 10*3/uL (ref 0.0–0.5)
Eosinophils Relative: 1 %
HCT: 33.5 % — ABNORMAL LOW (ref 36.0–46.0)
Hemoglobin: 11 g/dL — ABNORMAL LOW (ref 12.0–15.0)
Immature Granulocytes: 0 %
Lymphocytes Relative: 21 %
Lymphs Abs: 2.7 10*3/uL (ref 0.7–4.0)
MCH: 27.8 pg (ref 26.0–34.0)
MCHC: 32.8 g/dL (ref 30.0–36.0)
MCV: 84.8 fL (ref 80.0–100.0)
Monocytes Absolute: 0.8 10*3/uL (ref 0.1–1.0)
Monocytes Relative: 6 %
Neutro Abs: 9.2 10*3/uL — ABNORMAL HIGH (ref 1.7–7.7)
Neutrophils Relative %: 72 %
Platelets: 368 10*3/uL (ref 150–400)
RBC: 3.95 MIL/uL (ref 3.87–5.11)
RDW: 14.6 % (ref 11.5–15.5)
WBC: 12.8 10*3/uL — ABNORMAL HIGH (ref 4.0–10.5)
nRBC: 0 % (ref 0.0–0.2)

## 2022-01-11 LAB — HCG, SERUM, QUALITATIVE: Preg, Serum: NEGATIVE

## 2022-01-11 LAB — URINALYSIS, ROUTINE W REFLEX MICROSCOPIC
Bilirubin Urine: NEGATIVE
Glucose, UA: NEGATIVE mg/dL
Ketones, ur: NEGATIVE mg/dL
Nitrite: NEGATIVE
Protein, ur: NEGATIVE mg/dL
Specific Gravity, Urine: 1.02 (ref 1.005–1.030)
pH: 7.5 (ref 5.0–8.0)

## 2022-01-11 LAB — COMPREHENSIVE METABOLIC PANEL
ALT: 16 U/L (ref 0–44)
AST: 15 U/L (ref 15–41)
Albumin: 3.7 g/dL (ref 3.5–5.0)
Alkaline Phosphatase: 87 U/L (ref 38–126)
Anion gap: 8 (ref 5–15)
BUN: 13 mg/dL (ref 6–20)
CO2: 24 mmol/L (ref 22–32)
Calcium: 8.9 mg/dL (ref 8.9–10.3)
Chloride: 106 mmol/L (ref 98–111)
Creatinine, Ser: 1.12 mg/dL — ABNORMAL HIGH (ref 0.44–1.00)
GFR, Estimated: >60 mL/min (ref >60)
Glucose, Bld: 97 mg/dL (ref 70–99)
Potassium: 4.1 mmol/L (ref 3.5–5.1)
Sodium: 138 mmol/L (ref 135–145)
Total Bilirubin: 0.9 mg/dL (ref 0.3–1.2)
Total Protein: 7.9 g/dL (ref 6.5–8.1)

## 2022-01-11 LAB — URINALYSIS, MICROSCOPIC (REFLEX)

## 2022-01-11 LAB — WET PREP, GENITAL
Sperm: NONE SEEN
WBC, Wet Prep HPF POC: <10 (ref <10)
Yeast Wet Prep HPF POC: NONE SEEN

## 2022-01-11 LAB — LIPASE, BLOOD: Lipase: 28 U/L (ref 11–51)

## 2022-01-11 LAB — HIV ANTIBODY (ROUTINE TESTING W REFLEX): HIV Screen 4th Generation wRfx: NONREACTIVE

## 2022-01-11 MED ORDER — MORPHINE SULFATE (PF) 4 MG/ML IV SOLN
4.0000 mg | Freq: Once | INTRAVENOUS | Status: AC
  Administered 2022-01-11: 4 mg via INTRAVENOUS
  Filled 2022-01-11: qty 1

## 2022-01-11 MED ORDER — SODIUM CHLORIDE 0.9 % IV BOLUS
500.0000 mL | Freq: Once | INTRAVENOUS | Status: AC
  Administered 2022-01-11: 500 mL via INTRAVENOUS

## 2022-01-11 MED ORDER — ONDANSETRON HCL 4 MG/2ML IJ SOLN
4.0000 mg | Freq: Once | INTRAMUSCULAR | Status: AC
  Administered 2022-01-11: 4 mg via INTRAVENOUS
  Filled 2022-01-11: qty 2

## 2022-01-11 MED ORDER — METRONIDAZOLE 500 MG PO TABS: 500.0000 mg | ORAL_TABLET | Freq: Two times a day (BID) | ORAL | 0 refills | Status: AC

## 2022-01-11 MED ORDER — METRONIDAZOLE 500 MG PO TABS
500.0000 mg | ORAL_TABLET | Freq: Once | ORAL | Status: AC
  Administered 2022-01-11: 500 mg via ORAL
  Filled 2022-01-11: qty 1

## 2022-01-11 MED ORDER — DOXYCYCLINE HYCLATE 100 MG PO CAPS: 100.0000 mg | ORAL_CAPSULE | Freq: Two times a day (BID) | ORAL | 0 refills | Status: AC

## 2022-01-11 MED ORDER — SODIUM CHLORIDE 0.9 % IV SOLN
1.0000 g | Freq: Once | INTRAVENOUS | Status: AC
  Administered 2022-01-11: 1 g via INTRAVENOUS
  Filled 2022-01-11: qty 10

## 2022-01-11 MED ORDER — DOXYCYCLINE HYCLATE 100 MG PO TABS
100.0000 mg | ORAL_TABLET | Freq: Once | ORAL | Status: AC
  Administered 2022-01-11: 100 mg via ORAL
  Filled 2022-01-11: qty 1

## 2022-01-11 MED ORDER — IOHEXOL 300 MG/ML  SOLN
100.0000 mL | Freq: Once | INTRAMUSCULAR | Status: AC | PRN
  Administered 2022-01-11: 100 mL via INTRAVENOUS

## 2022-01-11 NOTE — Discharge Instructions (Signed)
You were seen in the emergency department today with lower abdominal pain.  I am treating you for pelvic inflammatory disease.  You have tested positive for a sexually transmitted infection.  Any sexual partner needs to be notified and tested/treated before you resume any sexual activity.  It is important that you take the full 2 weeks of antibiotics prescribed.  You will get nausea and vomiting if you drink alcohol while taking these antibiotics.  Please follow closely with your primary care doctor.  Have also listed the name and phone number for OB/GYN to call for follow-up.  If you develop any new or suddenly worsening symptoms please return for reevaluation. ?

## 2022-01-11 NOTE — ED Provider Notes (Signed)
? ?Emergency Department Provider Note ? ? ?I have reviewed the triage vital signs and the nursing notes. ? ? ?HISTORY ? ?Chief Complaint ?Abdominal Pain ? ? ?HPI ?Tara BienenstockDesiree Salazar is a 36 y.o. female presents to the emergency department for evaluation of lower abdominal pain.  Symptoms began 2 days prior.  She describes intermittent crampy type pain.  She recently completed her menstrual cycle several days before pain began.  She denies any vaginal discharge or continued bleeding.  No fevers.  She does have some mild dysuria.  No upper abdominal or chest discomfort.  No vomiting or diarrhea.  ? ? ?History reviewed. No pertinent past medical history. ? ?Review of Systems ? ?Constitutional: No fever/chills ?Eyes: No visual changes. ?ENT: No sore throat. ?Cardiovascular: Denies chest pain. ?Respiratory: Denies shortness of breath. ?Gastrointestinal: Positive lower abdominal pain.  No nausea, no vomiting.  No diarrhea.  No constipation. ?Genitourinary: Positive for dysuria. ?Musculoskeletal: Negative for back pain. ?Skin: Negative for rash. ?Neurological: Negative for headaches, focal weakness or numbness. ? ? ?____________________________________________ ? ? ?PHYSICAL EXAM: ? ?VITAL SIGNS: ?ED Triage Vitals  ?Enc Vitals Group  ?   BP 01/11/22 0929 (!) 151/92  ?   Pulse Rate 01/11/22 0929 80  ?   Resp 01/11/22 0929 18  ?   Temp 01/11/22 0929 98 ?F (36.7 ?C)  ?   Temp Source 01/11/22 0929 Oral  ?   SpO2 01/11/22 0929 99 %  ? ?Constitutional: Alert and oriented. Well appearing and in no acute distress. ?Eyes: Conjunctivae are normal.  ?Head: Atraumatic. ?Nose: No congestion/rhinnorhea. ?Mouth/Throat: Mucous membranes are moist. ?Neck: No stridor.   ?Cardiovascular: Normal rate, regular rhythm. Good peripheral circulation. Grossly normal heart sounds.   ?Respiratory: Normal respiratory effort.  No retractions. Lungs CTAB. ?Gastrointestinal: Soft with mild diffuse lower abdominal discomfort. No rebound or guarding. No  distention. ?GU: Patient examined by PA with direct supervision and nurse chaperone. Green/yellow discharge noted with positive CMT.   ?Musculoskeletal: No lower extremity tenderness nor edema. No gross deformities of extremities. ?Neurologic:  Normal speech and language. ?Skin:  Skin is warm, dry and intact. No rash noted. ? ?____________________________________________ ?  ?LABS ?(all labs ordered are listed, but only abnormal results are displayed) ? ?Labs Reviewed  ?WET PREP, GENITAL - Abnormal; Notable for the following components:  ?    Result Value  ? Trich, Wet Prep PRESENT (*)   ? Clue Cells Wet Prep HPF POC PRESENT (*)   ? All other components within normal limits  ?COMPREHENSIVE METABOLIC PANEL - Abnormal; Notable for the following components:  ? Creatinine, Ser 1.12 (*)   ? All other components within normal limits  ?CBC WITH DIFFERENTIAL/PLATELET - Abnormal; Notable for the following components:  ? WBC 12.8 (*)   ? Hemoglobin 11.0 (*)   ? HCT 33.5 (*)   ? Neutro Abs 9.2 (*)   ? All other components within normal limits  ?URINALYSIS, ROUTINE W REFLEX MICROSCOPIC - Abnormal; Notable for the following components:  ? APPearance HAZY (*)   ? Hgb urine dipstick TRACE (*)   ? Leukocytes,Ua SMALL (*)   ? All other components within normal limits  ?URINALYSIS, MICROSCOPIC (REFLEX) - Abnormal; Notable for the following components:  ? Bacteria, UA RARE (*)   ? All other components within normal limits  ?LIPASE, BLOOD  ?HCG, SERUM, QUALITATIVE  ?HIV ANTIBODY (ROUTINE TESTING W REFLEX)  ?RPR  ?GC/CHLAMYDIA PROBE AMP ([place]) NOT AT [facility]  ? ?____________________________________________ ? ?RADIOLOGY ? ?CT ABDOMEN  PELVIS W CONTRAST ? ?Result Date: 01/11/2022 ?CLINICAL DATA:  Acute right lower quadrant abdominal pain for 3 days. EXAM: CT ABDOMEN AND PELVIS WITH CONTRAST TECHNIQUE: Multidetector CT imaging of the abdomen and pelvis was performed using the standard protocol following bolus administration of intravenous  contrast. RADIATION DOSE REDUCTION: This exam was performed according to the departmental dose-optimization program which includes automated exposure control, adjustment of the mA and/or kV according to patient size and/or use of iterative reconstruction technique. CONTRAST:  OMNIPAQUE IOHEXOL 300 MG/ML  SOLN COMPARISON:  12/14/2020 FINDINGS: Lower Chest: No acute findings. Hepatobiliary: Stable tiny sub-cm low-attenuation lesion in the anterior right hepatic lobe which remains too small to characterize. No other liver lesions identified. Gallbladder is unremarkable. No evidence of biliary ductal dilatation. Pancreas:  No mass or inflammatory changes. Spleen: Within normal limits in size and appearance. Adrenals/Urinary Tract: No masses identified. Stable mild right renal parenchymal scarring. No evidence of ureteral calculi or hydronephrosis. Stomach/Bowel: No evidence of obstruction, inflammatory process or abnormal fluid collections. Normal appendix visualized. Vascular/Lymphatic: No pathologically enlarged lymph nodes. No acute vascular findings. Reproductive: No mass or other significant abnormality. Previously seen small right ovarian cyst has resolved since prior study. Other:  None. Musculoskeletal:  No suspicious bone lesions identified. IMPRESSION: No evidence of appendicitis or other acute findings. Resolution of small right ovarian cyst since prior study. Electronically Signed   By: Danae Orleans M.D.   On: 01/11/2022 11:56   ? ?____________________________________________ ? ? ?PROCEDURES ? ?Procedure(s) performed:  ? ?Procedures ? ?None  ?____________________________________________ ? ? ?INITIAL IMPRESSION / ASSESSMENT AND PLAN / ED COURSE ? ?Pertinent labs & imaging results that were available during my care of the patient were reviewed by me and considered in my medical decision making (see chart for details). ?  ?This patient is Presenting for Evaluation of lower abdominal pain, which does  require a range of treatment options, and is a complaint that involves a high risk of morbidity and mortality. ? ?The Differential Diagnoses includes but is not exclusive to ectopic pregnancy, ovarian cyst, ovarian torsion, acute appendicitis, urinary tract infection, endometriosis, bowel obstruction, hernia, colitis, renal colic, gastroenteritis, volvulus etc. ?. ? ?Critical Interventions-  ?  ?Medications  ?sodium chloride 0.9 % bolus 500 mL (0 mLs Intravenous Stopped 01/11/22 1121)  ?morphine (PF) 4 MG/ML injection 4 mg (4 mg Intravenous Given 01/11/22 1012)  ?ondansetron (ZOFRAN) injection 4 mg (4 mg Intravenous Given 01/11/22 1012)  ?iohexol (OMNIPAQUE) 300 MG/ML solution 100 mL (100 mLs Intravenous Contrast Given 01/11/22 1135)  ?cefTRIAXone (ROCEPHIN) 1 g in sodium chloride 0.9 % 100 mL IVPB (0 g Intravenous Stopped 01/11/22 1308)  ?metroNIDAZOLE (FLAGYL) tablet 500 mg (500 mg Oral Given 01/11/22 1240)  ?doxycycline (VIBRA-TABS) tablet 100 mg (100 mg Oral Given 01/11/22 1240)  ? ? ?Reassessment after intervention: patient's pain improved. ? ? ?I did obtain Additional Historical Information from SO at bedside. ? ?I decided to review pertinent External Data, and in summary no recent ED visits. ?  ?Clinical Laboratory Tests Ordered, included patient with leukocytosis to 12.8. Trich positive on wet prep. No AKI. Lipase is WNL.  ? ?Radiologic Tests Ordered, included CT abdomen/pelvis. I independently interpreted the images and agree with radiology interpretation.  ? ?Cardiac Monitor Tracing which shows NSR. ? ? ?Social Determinants of Health Risk positive smoking history. ? ?Medical Decision Making: Summary:  ?Patient presents to the emergency department for evaluation of lower abdominal discomfort.  She has tenderness on exam.  No peritonitis.  Some urinary tract type symptoms.  Plan for pelvic for further assessment although lower suspicion for PID/TOA.  Appendicitis is a consideration and will evaluate with CT imaging  and labs.  ? ?Reevaluation with update and discussion with patient.  We discussed her positive wet prep and elevated white blood cell count.  We discussed the results from CT imaging showing no appendicitis or

## 2022-01-11 NOTE — ED Triage Notes (Addendum)
Pt reports lower abdominal pain since Monday. Denies urinary symptoms. No NVD.  ?

## 2022-01-12 LAB — RPR: RPR Ser Ql: NONREACTIVE

## 2022-01-12 LAB — GC/CHLAMYDIA PROBE AMP (~~LOC~~) NOT AT ARMC
Chlamydia: NEGATIVE
Comment: NEGATIVE
Comment: NORMAL
Neisseria Gonorrhea: POSITIVE — AB

## 2022-05-11 ENCOUNTER — Encounter (HOSPITAL_BASED_OUTPATIENT_CLINIC_OR_DEPARTMENT_OTHER): Payer: Self-pay | Admitting: Emergency Medicine

## 2022-05-11 ENCOUNTER — Emergency Department (HOSPITAL_BASED_OUTPATIENT_CLINIC_OR_DEPARTMENT_OTHER)
Admission: EM | Admit: 2022-05-11 | Discharge: 2022-05-11 | Discharge status: Against Medical Advice | Payer: Medicaid Other | Attending: Emergency Medicine | Admitting: Emergency Medicine

## 2022-05-11 ENCOUNTER — Other Ambulatory Visit: Payer: Self-pay

## 2022-05-11 DIAGNOSIS — N644 Mastodynia: Secondary | ICD-10-CM | POA: Diagnosis not present

## 2022-05-11 DIAGNOSIS — Z5321 Procedure and treatment not carried out due to patient leaving prior to being seen by health care provider: Secondary | ICD-10-CM | POA: Insufficient documentation

## 2022-05-11 DIAGNOSIS — M791 Myalgia, unspecified site: Secondary | ICD-10-CM | POA: Diagnosis present

## 2022-05-11 NOTE — ED Triage Notes (Signed)
Patient presents to ED via POV from home. Reports body aches that began this morning. Reports pain to nipples. Reports "I could be pregnant I dont know".

## 2022-05-11 NOTE — ED Notes (Signed)
Called to take to a treatment room  No response from lobby

## 2022-05-11 NOTE — ED Notes (Signed)
Called for second time  No response from lobby  Registration clerk reports pt left

## 2022-05-12 ENCOUNTER — Other Ambulatory Visit: Payer: Self-pay

## 2022-05-12 ENCOUNTER — Emergency Department (HOSPITAL_BASED_OUTPATIENT_CLINIC_OR_DEPARTMENT_OTHER)
Admission: EM | Admit: 2022-05-12 | Discharge: 2022-05-12 | Disposition: A | Discharge status: Home / Self Care | Payer: Medicaid Other | Attending: Emergency Medicine | Admitting: Emergency Medicine

## 2022-05-12 ENCOUNTER — Encounter (HOSPITAL_BASED_OUTPATIENT_CLINIC_OR_DEPARTMENT_OTHER): Payer: Self-pay | Admitting: Emergency Medicine

## 2022-05-12 DIAGNOSIS — N3000 Acute cystitis without hematuria: Secondary | ICD-10-CM | POA: Diagnosis not present

## 2022-05-12 DIAGNOSIS — R110 Nausea: Secondary | ICD-10-CM | POA: Insufficient documentation

## 2022-05-12 DIAGNOSIS — Z3201 Encounter for pregnancy test, result positive: Secondary | ICD-10-CM | POA: Diagnosis present

## 2022-05-12 LAB — URINALYSIS, MICROSCOPIC (REFLEX)

## 2022-05-12 LAB — URINALYSIS, ROUTINE W REFLEX MICROSCOPIC
Bilirubin Urine: NEGATIVE
Glucose, UA: NEGATIVE mg/dL
Hgb urine dipstick: NEGATIVE
Ketones, ur: NEGATIVE mg/dL
Leukocytes,Ua: NEGATIVE
Nitrite: POSITIVE — AB
Protein, ur: NEGATIVE mg/dL
Specific Gravity, Urine: 1.025 (ref 1.005–1.030)
pH: 6.5 (ref 5.0–8.0)

## 2022-05-12 LAB — PREGNANCY, URINE: Preg Test, Ur: POSITIVE — AB

## 2022-05-12 MED ORDER — CEPHALEXIN 250 MG PO CAPS: 250.0000 mg | ORAL_CAPSULE | Freq: Four times a day (QID) | ORAL | 0 refills | Status: AC

## 2022-05-12 NOTE — Discharge Instructions (Signed)
Your pregnancy test was positive.  I recommend following up with an OB/GYN as soon as possible. Your urine also looked like you have evidence of a urinary tract infection.  I have sent in an antibiotic to your pharmacy.  Please take this as prescribed

## 2022-05-12 NOTE — ED Provider Notes (Signed)
[facility] EMERGENCY DEPARTMENT Provider Note   CSN: [id number] Arrival date & time: 05/12/22  1002     History PMH : g2p1 Chief Complaint  Patient presents with   Possible Pregnancy    Tara Salazar is a 36 y.o. female. Patient presents to the ED with a chief complaint of needing a repeat pregnancy test.  She says that her last menstrual cycle was on March 07, 2022.  She does states she typically has irregular periods though.  She has had some mild nausea as well as some breast tenderness.  She had a positive pregnancy test at home, and wants confirmation.  She denies any vomiting, abdominal pain, syncope, dizziness, or other concerns.  Denies any urinary symptoms She has a follow-up OB/GYN appointment at the beginning of next month.   Possible Pregnancy Pertinent negatives include no abdominal pain.       Home Medications Prior to Admission medications   Medication Sig Start Date End Date Taking? Authorizing Provider  cephALEXin (KEFLEX) 250 MG capsule Take 1 capsule (250 mg total) by mouth 4 (four) times daily for 5 days. 05/12/22 05/17/22 Yes Gena Laski, Finis Bud, PA-C  famotidine (PEPCID) 20 MG tablet Take 1 tablet (20 mg total) by mouth 2 (two) times daily. 03/09/21   Renne Crigler, PA-C      Allergies    Patient has no known allergies.    Review of Systems   Review of Systems  Constitutional:  Negative for fever.  Gastrointestinal:  Positive for nausea. Negative for abdominal pain, constipation, diarrhea and vomiting.  Genitourinary:  Negative for dysuria, flank pain, hematuria and pelvic pain.       Breast tenderness  Neurological:  Negative for dizziness.  All other systems reviewed and are negative.   Physical Exam Updated Vital Signs BP 114/74 (BP Location: Left Arm)   Pulse 67   Temp 97.9 F (36.6 C) (Oral)   Resp 20   Ht 5\' 7"  (1.702 m)   LMP 03/07/2022   SpO2 100%   BMI 35.24 kg/m  Physical Exam Vitals and nursing note reviewed.   Constitutional:      General: She is not in acute distress.    Appearance: Normal appearance. She is well-developed. She is not ill-appearing, toxic-appearing or diaphoretic.  HENT:     Head: Normocephalic and atraumatic.     Nose: No nasal deformity.     Mouth/Throat:     Lips: Pink. No lesions.  Eyes:     General: Gaze aligned appropriately. No scleral icterus.       Right eye: No discharge.        Left eye: No discharge.     Conjunctiva/sclera: Conjunctivae normal.     Right eye: Right conjunctiva is not injected. No exudate or hemorrhage.    Left eye: Left conjunctiva is not injected. No exudate or hemorrhage. Pulmonary:     Effort: Pulmonary effort is normal. No respiratory distress.  Abdominal:     General: Abdomen is flat. There is no distension.     Palpations: Abdomen is soft.     Tenderness: There is no abdominal tenderness. There is no right CVA tenderness, left CVA tenderness, guarding or rebound.  Skin:    General: Skin is warm and dry.  Neurological:     Mental Status: She is alert and oriented to person, place, and time.  Psychiatric:        Mood and Affect: Mood normal.        Speech: Speech  normal.        Behavior: Behavior normal. Behavior is cooperative.     ED Results / Procedures / Treatments   Labs (all labs ordered are listed, but only abnormal results are displayed) Labs Reviewed  PREGNANCY, URINE - Abnormal; Notable for the following components:      Result Value   Preg Test, Ur POSITIVE (*)    All other components within normal limits  URINALYSIS, ROUTINE W REFLEX MICROSCOPIC - Abnormal; Notable for the following components:   APPearance CLOUDY (*)    Nitrite POSITIVE (*)    All other components within normal limits  URINALYSIS, MICROSCOPIC (REFLEX) - Abnormal; Notable for the following components:   Bacteria, UA MANY (*)    All other components within normal limits  URINE CULTURE    EKG None  Radiology No results  found.  Procedures Procedures   Medications Ordered in ED Medications - No data to display  ED Course/ Medical Decision Making/ A&P                           Medical Decision Making Amount and/or Complexity of Data Reviewed Labs: ordered.  Risk Prescription drug management.   Patient presents needing pregnancy test.  She has been having some mild symptoms such as nausea and breast tenderness over the past week.  She had a positive pregnancy test at home and wanted verification.   She did test positive for pregnancy here based on a urine pregnancy sample. Her last menstrual cycle was on June 13, however this may not be accurate given history of irregular periods.  Based on her last menstrual cycle it would make her approximately 9 weeks and 3 days.   Her urine does show evidence of bacteria as well as nitrites.  She is asymptomatic from a UTI standpoint, but since she has a positive pregnancy test we will treat this. No other reported complications or any other indication for emergent ultrasound at this time.  She has follow-up with OB/GYN.  Stable for discharge.   Final Clinical Impression(s) / ED Diagnoses Final diagnoses:  Positive pregnancy test  Acute cystitis without hematuria    Rx / DC Orders ED Discharge Orders          Ordered    cephALEXin (KEFLEX) 250 MG capsule  4 times daily        05/12/22 1112              [name], Kathee Delton 05/12/22 1117    Rondel Baton, MD 05/13/22 1122

## 2022-05-12 NOTE — ED Triage Notes (Signed)
Pt requesting pregnancy test.  She took one at home and was positive and wants confirmation.  No pregnancy symptoms.  Last cycle in June

## 2022-05-13 LAB — URINE CULTURE: Culture: 1000 — AB

## 2022-05-14 ENCOUNTER — Telehealth (HOSPITAL_BASED_OUTPATIENT_CLINIC_OR_DEPARTMENT_OTHER): Payer: Self-pay | Admitting: *Deleted

## 2022-05-14 NOTE — Telephone Encounter (Signed)
Post ED Visit - Positive Culture Follow-up  Culture report reviewed by antimicrobial stewardship pharmacist: [facility] []  , Pharm.D. []  Enzo Bi, Pharm.D., BCPS AQ-ID []  , Pharm.D., BCPS []  Celedonio Miyamoto, Pharm.D., BCPS []  [place], Garvin Fila.D., BCPS, AAHIVP []  , Pharm.D., BCPS, AAHIVP []  Georgina Pillion, PharmD, BCPS []  , PharmD, BCPS []  [place], PharmD, BCPS []  [address], PharmD []  , PharmD, BCPS [x]  Estella Husk, PharmD  Pharmacy Team []  Lysle Pearl, PharmD []  , PharmD []  Phillips Climes, PharmD []  , Rph []  Agapito Games) , PharmD []  Verlan Friends, PharmD []  , PharmD []  Mervyn Gay, PharmD []  , PharmD []  Drema Pry, PharmD []  [facility], PharmD []  , PharmD []  Len Childs, PharmD   Positive urine culture Treated with Cephalexin, organism sensitive to the same and no further patient follow-up is required at this time.  05/14/2022, 11:21 AM

## 2022-06-26 ENCOUNTER — Other Ambulatory Visit (HOSPITAL_BASED_OUTPATIENT_CLINIC_OR_DEPARTMENT_OTHER): Payer: Self-pay

## 2022-06-26 ENCOUNTER — Other Ambulatory Visit: Payer: Self-pay

## 2022-06-26 ENCOUNTER — Encounter (HOSPITAL_BASED_OUTPATIENT_CLINIC_OR_DEPARTMENT_OTHER): Payer: Self-pay | Admitting: Emergency Medicine

## 2022-06-26 ENCOUNTER — Emergency Department (HOSPITAL_BASED_OUTPATIENT_CLINIC_OR_DEPARTMENT_OTHER)
Admission: EM | Admit: 2022-06-26 | Discharge: 2022-06-26 | Disposition: A | Discharge status: Home / Self Care | Payer: Medicaid Other | Attending: Emergency Medicine | Admitting: Emergency Medicine

## 2022-06-26 ENCOUNTER — Emergency Department (HOSPITAL_BASED_OUTPATIENT_CLINIC_OR_DEPARTMENT_OTHER): Payer: Medicaid Other

## 2022-06-26 DIAGNOSIS — O2310 Infections of bladder in pregnancy, unspecified trimester: Secondary | ICD-10-CM | POA: Diagnosis not present

## 2022-06-26 DIAGNOSIS — Z3A14 14 weeks gestation of pregnancy: Secondary | ICD-10-CM | POA: Diagnosis not present

## 2022-06-26 DIAGNOSIS — O99512 Diseases of the respiratory system complicating pregnancy, second trimester: Secondary | ICD-10-CM | POA: Insufficient documentation

## 2022-06-26 DIAGNOSIS — Z20822 Contact with and (suspected) exposure to covid-19: Secondary | ICD-10-CM | POA: Insufficient documentation

## 2022-06-26 DIAGNOSIS — Z3492 Encounter for supervision of normal pregnancy, unspecified, second trimester: Secondary | ICD-10-CM

## 2022-06-26 DIAGNOSIS — N390 Urinary tract infection, site not specified: Secondary | ICD-10-CM

## 2022-06-26 DIAGNOSIS — O26892 Other specified pregnancy related conditions, second trimester: Secondary | ICD-10-CM | POA: Diagnosis present

## 2022-06-26 DIAGNOSIS — J029 Acute pharyngitis, unspecified: Secondary | ICD-10-CM

## 2022-06-26 LAB — GROUP A STREP BY PCR: Group A Strep by PCR: NOT DETECTED

## 2022-06-26 LAB — RESP PANEL BY RT-PCR (FLU A&B, COVID) ARPGX2
Influenza A by PCR: NEGATIVE
Influenza B by PCR: NEGATIVE
SARS Coronavirus 2 by RT PCR: NEGATIVE

## 2022-06-26 LAB — URINALYSIS, ROUTINE W REFLEX MICROSCOPIC
Bilirubin Urine: NEGATIVE
Glucose, UA: NEGATIVE mg/dL
Hgb urine dipstick: NEGATIVE
Ketones, ur: 15 mg/dL — AB
Nitrite: POSITIVE — AB
Protein, ur: 30 mg/dL — AB
Specific Gravity, Urine: 1.02 (ref 1.005–1.030)
pH: 7 (ref 5.0–8.0)

## 2022-06-26 LAB — URINALYSIS, MICROSCOPIC (REFLEX)

## 2022-06-26 MED ORDER — AMOXICILLIN 250 MG/5ML PO SUSR
500.0000 mg | Freq: Three times a day (TID) | ORAL | 0 refills | Status: AC
Start: 2022-06-26 — End: ?

## 2022-06-26 MED ORDER — AMOXICILLIN 250 MG/5ML PO SUSR
500.0000 mg | Freq: Three times a day (TID) | ORAL | 0 refills | Status: DC
Start: 2022-06-26 — End: 2022-06-26
  Filled 2022-06-26: qty 300, 10d supply, fill #0

## 2022-06-26 NOTE — ED Provider Notes (Signed)
[facility]PART[facility] Provider Note   CSN: [id number] Arrival date & time: 06/26/22  [id number]     History  Chief Complaint  Patient presents with   Sore Throat    Tara Salazar is a 36 y.o. female.  She is approximately [redacted] weeks pregnant.  Complaining of 2 days of sore throat.  She said she had a cold last week with runny nose and cough, caught from her kids.  No fevers or chills.  No vomiting diarrhea or urinary symptoms.  No vaginal bleeding.  No abdominal pain.  The history is provided by the patient.  Sore Throat This is a new problem. The current episode started 2 days ago. The problem occurs constantly. The problem has not changed since onset.Pertinent negatives include no chest pain, no abdominal pain, no headaches and no shortness of breath. The symptoms are aggravated by swallowing. Nothing relieves the symptoms. She has tried rest for the symptoms. The treatment provided no relief.       Home Medications Prior to Admission medications   Medication Sig Start Date End Date Taking? Authorizing Provider  famotidine (PEPCID) 20 MG tablet Take 1 tablet (20 mg total) by mouth 2 (two) times daily. 03/09/21   Carlisle Cater, PA-C      Allergies    Patient has no known allergies.    Review of Systems   Review of Systems  Constitutional:  Negative for fever.  HENT:  Positive for rhinorrhea and sore throat.   Respiratory:  Positive for cough. Negative for shortness of breath.   Cardiovascular:  Negative for chest pain.  Gastrointestinal:  Negative for abdominal pain.  Genitourinary:  Negative for dysuria.  Neurological:  Negative for headaches.    Physical Exam Updated Vital Signs BP 116/70 (BP Location: Right Arm)   Pulse 82   Temp 98.2 F (36.8 C) (Oral)   Resp 18   Ht 5\' 7"  (1.702 m)   Wt 105.2 kg   LMP 03/07/2022   SpO2 98%   BMI 36.34 kg/m  Physical Exam Vitals and nursing note reviewed.  Constitutional:      General: She is not in acute  distress.    Appearance: She is well-developed.  HENT:     Head: Normocephalic and atraumatic.     Right Ear: Tympanic membrane normal.     Left Ear: Tympanic membrane normal.     Mouth/Throat:     Mouth: Mucous membranes are moist.     Pharynx: Posterior oropharyngeal erythema present. No oropharyngeal exudate.     Tonsils: No tonsillar exudate or tonsillar abscesses.  Eyes:     Conjunctiva/sclera: Conjunctivae normal.  Cardiovascular:     Rate and Rhythm: Normal rate and regular rhythm.     Heart sounds: No murmur heard. Pulmonary:     Effort: Pulmonary effort is normal. No respiratory distress.     Breath sounds: Normal breath sounds.  Abdominal:     Palpations: Abdomen is soft.     Tenderness: There is no abdominal tenderness.  Musculoskeletal:        General: No swelling.     Cervical back: Neck supple.  Skin:    General: Skin is warm and dry.     Capillary Refill: Capillary refill takes less than 2 seconds.  Neurological:     General: No focal deficit present.     Mental Status: She is alert.     ED Results / Procedures / Treatments   Labs (all labs ordered are listed,  but only abnormal results are displayed) Labs Reviewed  URINALYSIS, ROUTINE W REFLEX MICROSCOPIC - Abnormal; Notable for the following components:      Result Value   APPearance HAZY (*)    Ketones, ur 15 (*)    Protein, ur 30 (*)    Nitrite POSITIVE (*)    Leukocytes,Ua TRACE (*)    All other components within normal limits  URINALYSIS, MICROSCOPIC (REFLEX) - Abnormal; Notable for the following components:   Bacteria, UA MANY (*)    All other components within normal limits  RESP PANEL BY RT-PCR (FLU A&B, COVID) [id number]  GROUP A STREP BY PCR    EKG None  Radiology DG Chest Port 1 View  Result Date: 06/26/2022 CLINICAL DATA:  Provided history: Cough. Additional history provided: Patient reports throat pain and productive cough. EXAM: PORTABLE CHEST 1 VIEW COMPARISON:  Prior chest  radiographs 12/03/2015 and earlier. FINDINGS: Heart size within normal limits. No appreciable airspace consolidation. No evidence of pleural effusion or pneumothorax. No acute bony abnormality identified. Dextrocurvature of the thoracic spine. IMPRESSION: No evidence of active cardiopulmonary disease. Dextrocurvature of the thoracic spine. Electronically Signed   By: Kellie Simmering D.O.   On: 06/26/2022 10:40    Procedures Procedures    Medications Ordered in ED Medications - No data to display  ED Course/ Medical Decision Making/ A&P Clinical Course as of 06/26/22 1726  Mon Jun 26, 2022  1029 Chest x-ray interpreted by me as no acute infiltrates.  Awaiting radiology reading. [MB]    Clinical Course User Index [MB] Hayden Rasmussen, MD                           Medical Decision Making Amount and/or Complexity of Data Reviewed Labs: ordered. Radiology: ordered.  Risk Prescription drug management.  Tara Salazar was evaluated in Emergency Department on 06/26/2022 for the symptoms described in the history of present illness. She was evaluated in the context of the global COVID-19 pandemic, which necessitated consideration that the patient might be at risk for infection with the SARS-CoV-2 virus that causes COVID-19. Institutional protocols and algorithms that pertain to the evaluation of patients at risk for COVID-19 are in a state of rapid change based on information released by regulatory bodies including the CDC and federal and state organizations. These policies and algorithms were followed during the patient's care in the ED. This patient complains of cough sore throat; this involves an extensive number of treatment Options and is a complaint that carries with it a high risk of complications and morbidity. The differential includes viral syndrome, strep, COVID, flu  I ordered, reviewed and interpreted labs, which included strep negative COVID and flu negative, urinalysis concerning  for infection  I ordered imaging studies which included chest x-ray and I independently    visualized and interpreted imaging which showed no acute findings Previous records obtained and reviewed in epic, was recently on Macrobid for positive urine Social determinants considered, no significant barriers Critical Interventions: None  After the interventions stated above, I reevaluated the patient and found patient to be nontoxic-appearing Admission and further testing considered, will cover with antibiotics for bacteriuria in the setting of pregnancy.  Symptomatic treatment for URI discussed.  Return instructions discussed          Final Clinical Impression(s) / ED Diagnoses Final diagnoses:  Viral pharyngitis  Acute lower UTI  Second trimester pregnancy    Rx / DC Orders ED Discharge  Orders          Ordered    amoxicillin (AMOXIL) 250 MG/5ML suspension  3 times daily,   Status:  Discontinued        06/26/22 1101    amoxicillin (AMOXIL) 250 MG/5ML suspension  3 times daily        06/26/22 1123              Hayden Rasmussen, MD 06/26/22 1728

## 2022-06-26 NOTE — ED Triage Notes (Signed)
C/O throat pain and productive cough, denies fever at home. Reported 14 weeks and  4 days pregnant; G6P5 EDD 12/21/2022

## 2022-06-26 NOTE — Discharge Instructions (Signed)
You were seen in the emergency department for sore throat cough.  Your COVID and strep test were negative and your chest x-ray was clear.  Your urine did show signs of a urinary tract infection and we are putting you on antibiotics.  Please use Tylenol for pain and drink plenty of fluids.  Warm salt water gargles.  Return to the emergency department if any worsening or concerning symptoms

## 2022-08-19 ENCOUNTER — Other Ambulatory Visit: Payer: Self-pay

## 2022-08-19 ENCOUNTER — Encounter (HOSPITAL_BASED_OUTPATIENT_CLINIC_OR_DEPARTMENT_OTHER): Payer: Self-pay | Admitting: Emergency Medicine

## 2022-08-19 ENCOUNTER — Emergency Department (HOSPITAL_BASED_OUTPATIENT_CLINIC_OR_DEPARTMENT_OTHER)
Admission: EM | Admit: 2022-08-19 | Discharge: 2022-08-19 | Discharge status: Against Medical Advice | Payer: Medicaid Other | Attending: Emergency Medicine | Admitting: Emergency Medicine

## 2022-08-19 ENCOUNTER — Telehealth (HOSPITAL_BASED_OUTPATIENT_CLINIC_OR_DEPARTMENT_OTHER): Payer: Self-pay | Admitting: Emergency Medicine

## 2022-08-19 DIAGNOSIS — O26892 Other specified pregnancy related conditions, second trimester: Secondary | ICD-10-CM | POA: Diagnosis present

## 2022-08-19 DIAGNOSIS — Z3A22 22 weeks gestation of pregnancy: Secondary | ICD-10-CM | POA: Insufficient documentation

## 2022-08-19 DIAGNOSIS — R102 Pelvic and perineal pain: Secondary | ICD-10-CM | POA: Insufficient documentation

## 2022-08-19 DIAGNOSIS — O26899 Other specified pregnancy related conditions, unspecified trimester: Secondary | ICD-10-CM

## 2022-08-19 NOTE — ED Triage Notes (Signed)
Pt reports she is [redacted] weeks pregnant and is having bilateral lower abd pain for the past 2 hours. Also having emesis. No diarrhea. No issues with pregnancy so far. Denies vaginal discharge or bleeding.

## 2022-08-19 NOTE — ED Notes (Signed)
Pt states intermittent cramping low abdomen radiating to back, sees OB at [facility],  Did not speak to OB prior to coming in.  Provider made aware, has made arrangements for pt to be evaluated at [facility], cleared for private vehicle transport.

## 2022-08-19 NOTE — ED Notes (Signed)
Report called to intake nurse at [facility]. Pt approved for evaluation by Dr Jolayne Panther.  Will go by private vehicle

## 2022-08-19 NOTE — ED Provider Notes (Signed)
[facility] EMERGENCY DEPARTMENT Provider Note   CSN: [id number] Arrival date & time: 08/19/22  1712     History  Chief Complaint  Patient presents with   Abdominal Pain    Tara Salazar is a 36 y.o. female.  Patient is a 36 year old female at [redacted] weeks gestation presenting to the emergency department with abdominal pain.  The patient states that she has had lower abdominal pain for the last 2 hours.  She states that the pain has been coming and going and feels similar to contraction type of pain.  She denies any fevers or chills.  She states that she had 1 episode of vomiting but no longer feels nauseous.  She denies any diarrhea or constipation, dysuria or hematuria, vaginal bleeding or leakage of fluid.  She states that she has not yet felt the baby move this pregnancy.  She denies any other complications with this pregnancy thus far.  The history is provided by the patient.  Abdominal Pain      Home Medications Prior to Admission medications   Medication Sig Start Date End Date Taking? Authorizing Provider  amoxicillin (AMOXIL) 250 MG/5ML suspension Take 10 mLs (500 mg total) by mouth 3 (three) times daily. *Discard any remainder* 06/26/22   Terrilee Files, MD  famotidine (PEPCID) 20 MG tablet Take 1 tablet (20 mg total) by mouth 2 (two) times daily. 03/09/21   Renne Crigler, PA-C      Allergies    Patient has no known allergies.    Review of Systems   Review of Systems  Gastrointestinal:  Positive for abdominal pain.    Physical Exam Updated Vital Signs BP 109/64   Pulse 81   Temp 98.2 F (36.8 C) (Oral)   Resp 18   LMP 03/07/2022   SpO2 97%  Physical Exam Vitals and nursing note reviewed.  Constitutional:      General: She is not in acute distress.    Appearance: She is well-developed.  HENT:     Head: Normocephalic and atraumatic.     Mouth/Throat:     Mouth: Mucous membranes are moist.  Eyes:     Extraocular Movements: Extraocular  movements intact.  Cardiovascular:     Rate and Rhythm: Normal rate and regular rhythm.  Pulmonary:     Effort: Pulmonary effort is normal.     Breath sounds: Normal breath sounds.  Abdominal:     Palpations: Abdomen is soft.     Tenderness: There is abdominal tenderness in the suprapubic area. There is no guarding or rebound.     Comments: Gravid approximately to umbilicus  Skin:    General: Skin is warm and dry.  Neurological:     General: No focal deficit present.     Mental Status: She is alert and oriented to person, place, and time.  Psychiatric:        Mood and Affect: Mood normal.        Behavior: Behavior normal.     ED Results / Procedures / Treatments   Labs (all labs ordered are listed, but only abnormal results are displayed) Labs Reviewed - No data to display  EKG None  Radiology No results found.  Procedures Procedures    Medications Ordered in ED Medications - No data to display  ED Course/ Medical Decision Making/ A&P                           Medical Decision  Making This patient presents to the ED with chief complaint(s) of abdominal pain with pertinent past medical history of pregnant at [redacted] weeks gestation which further complicates the presenting complaint. The complaint involves an extensive differential diagnosis and also carries with it a high risk of complications and morbidity.    The differential diagnosis includes preterm labor, UTI, appendicitis or other intraabdominal infection unlikely as no point tenderness and no fevers   Additional history obtained: Additional history obtained from N/A Records reviewed Care Everywhere/External Records  ED Course and Reassessment: Upon patient's arrival to the emergency department she is awake and alert in no acute distress and is hemodynamically stable.  She has no signs of infection on her exam.  She is having pelvic pain and pain that she describes similar to contraction type of pain.  I called [facility]  and spoke to Dr. Elly Modena of OB/GYN who accepted her for transfer for further evaluation of her abdominal pain in pregnancy.  Patient will be transferred in stable condition.  Independent labs interpretation:  N/A  Independent visualization of imaging: N/A  Consultation: - Consulted or discussed management/test interpretation w/ external professional: OBGYN  Consideration for admission or further workup: patient requires further work up and evaluation by [facility] Social Determinants of health: N/A    Risk Decision regarding hospitalization.          Final Clinical Impression(s) / ED Diagnoses Final diagnoses:  Pelvic pain in pregnancy    Rx / DC Orders ED Discharge Orders     None         Kemper Durie, DO 08/19/22 1801

## 2023-10-30 IMAGING — CT CT ABD-PELV W/ CM
2 of 4 series · 16 of 46 positions shown, 18 images · IV contrast (Omnipaque)
Comparison: 12/14/2020

CLINICAL DATA: Acute right lower quadrant abdominal pain for 3
days.

EXAM:
CT ABDOMEN AND PELVIS WITH CONTRAST
TECHNIQUE: Multidetector CT imaging of the abdomen and pelvis was performed
using the standard protocol following bolus administration of
intravenous contrast.

[Series 2: axial st · axial · 0.98mm/px · z∈[-542,-92]mm · 13 of 100 slices shown, 15 images]
[im 5/100  soft-tissue]
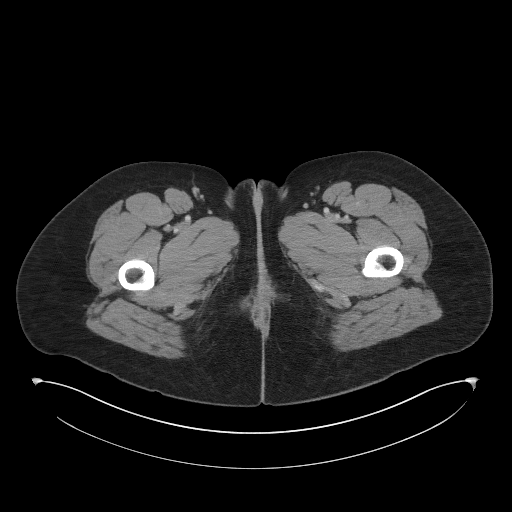
[im 5/100  bone]
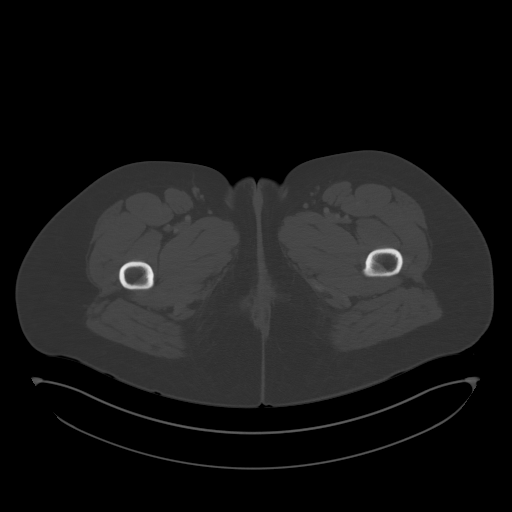
[im 13/100  soft-tissue]
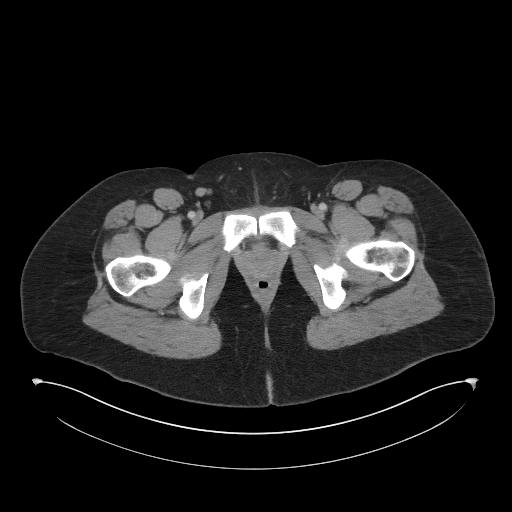
[im 21/100  soft-tissue]
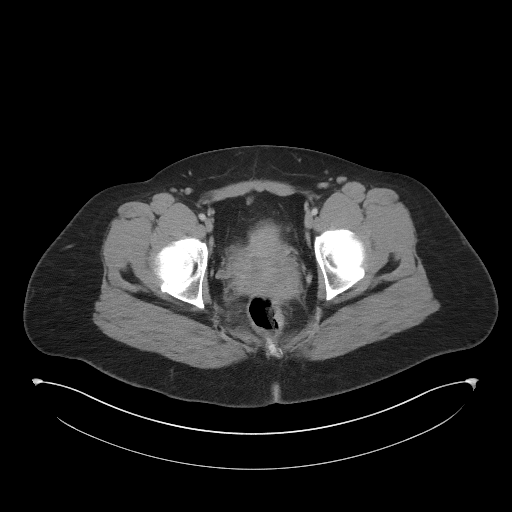
[im 29/100  soft-tissue]
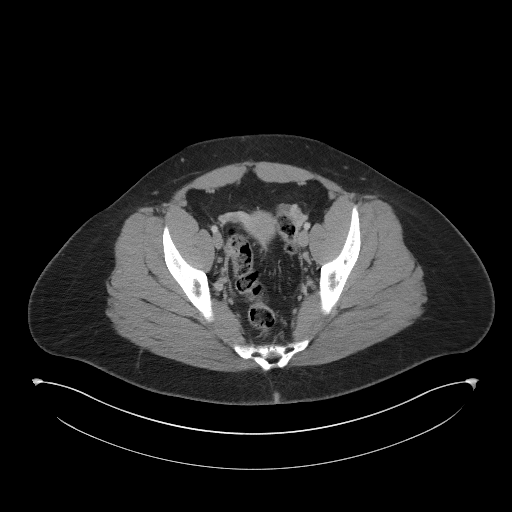
[im 34/100  soft-tissue]
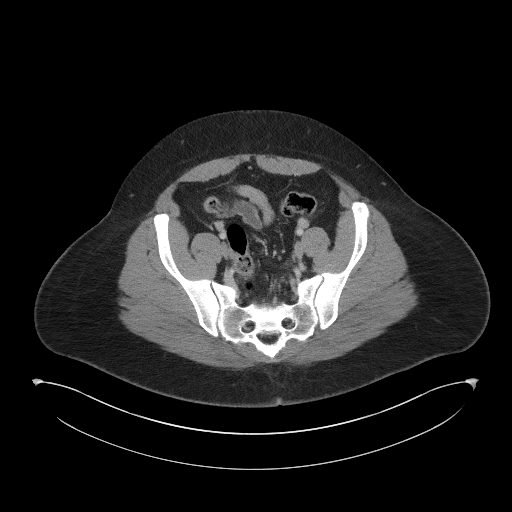
[im 42/100  soft-tissue]
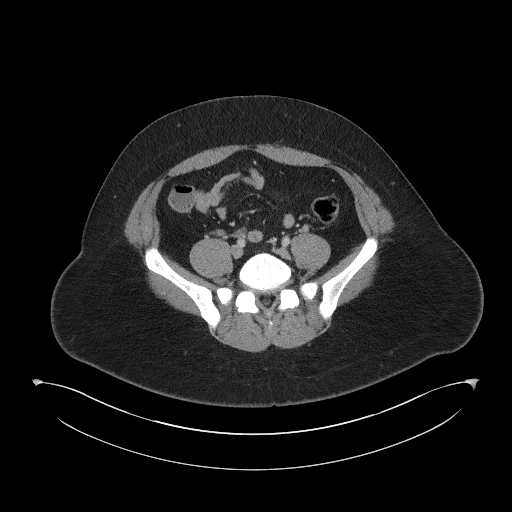
[im 50/100  soft-tissue]
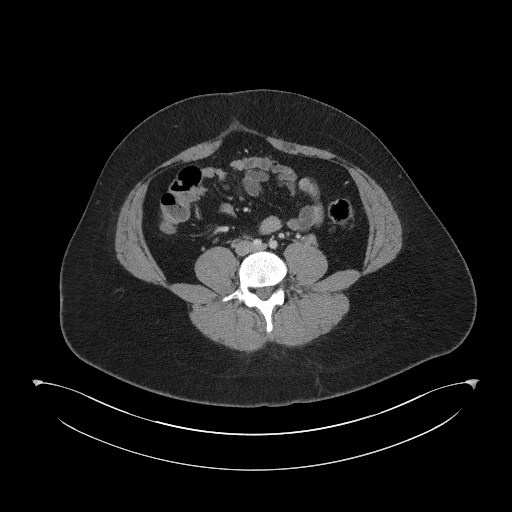
[im 58/100  soft-tissue]
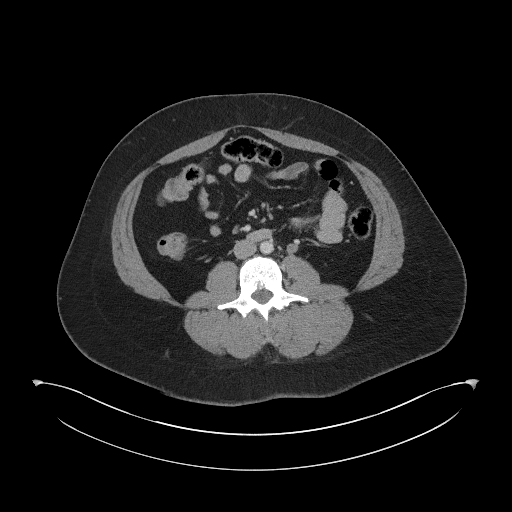
[im 67/100  soft-tissue]
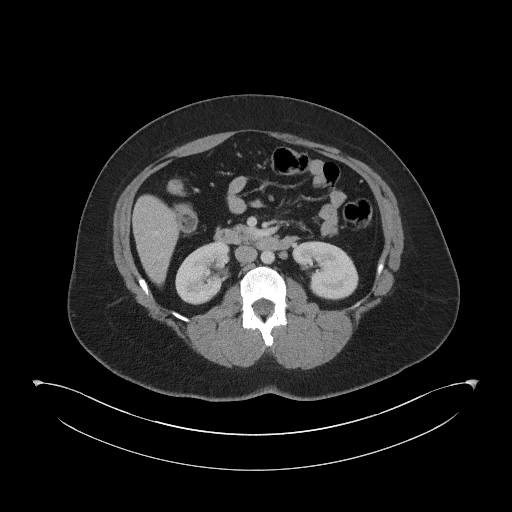
[im 67/100  bone]
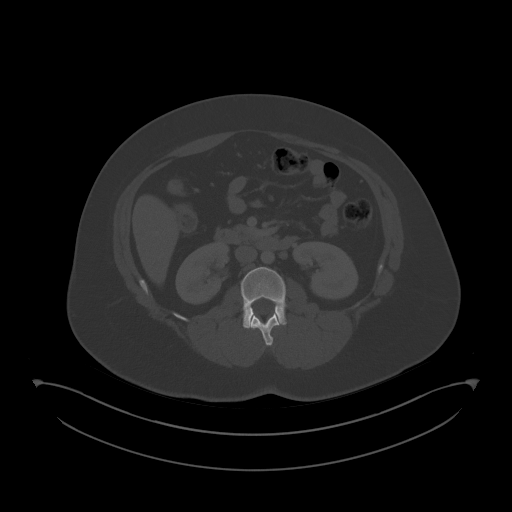
[im 71/100  soft-tissue]
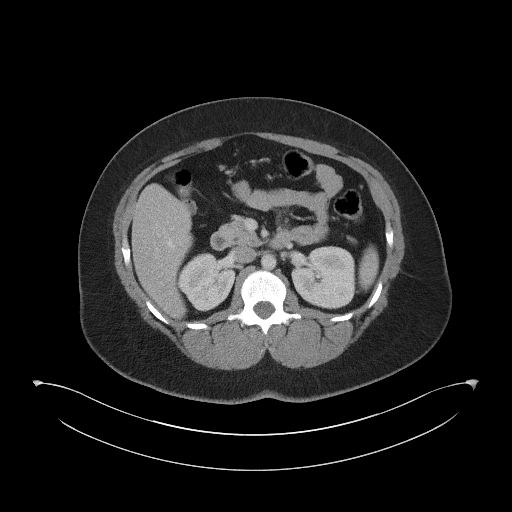
[im 79/100  soft-tissue]
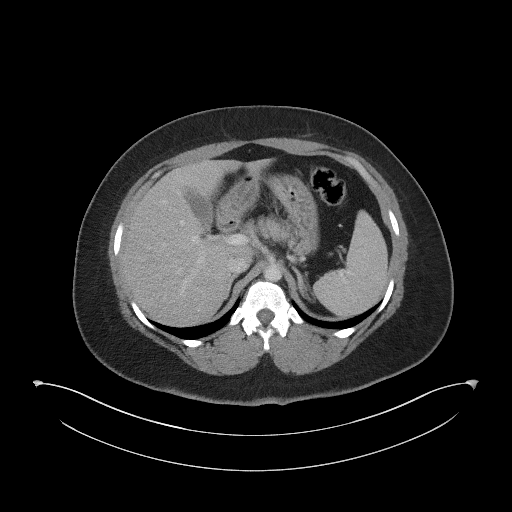
[im 87/100  soft-tissue]
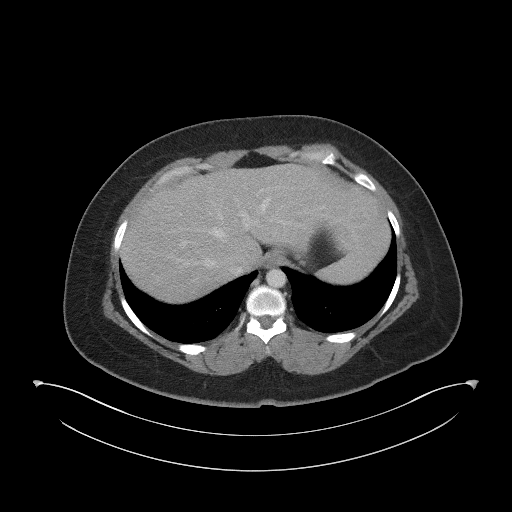
[im 95/100  soft-tissue]
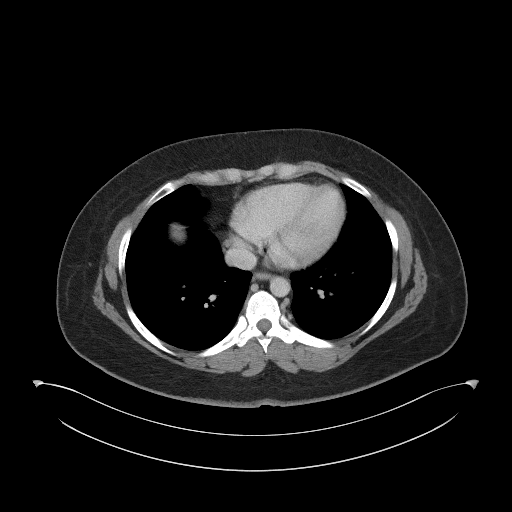

[Series 5: coronal st · coronal · 0.87mm/px · 3 of 105 slices shown]
[im 35/105  soft-tissue]
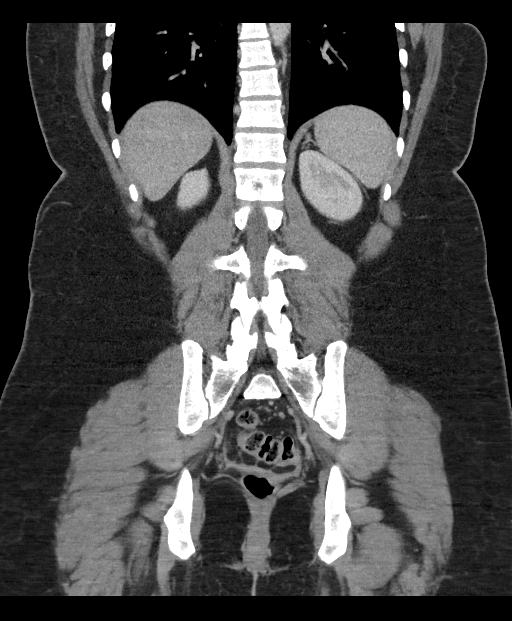
[im 47/105  soft-tissue]
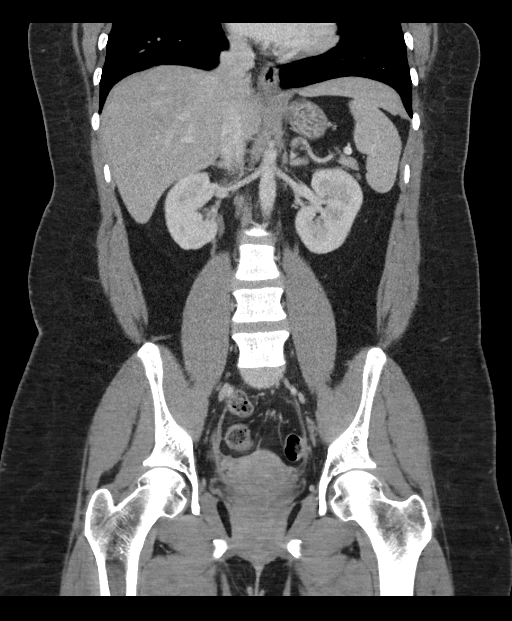
[im 58/105  soft-tissue]
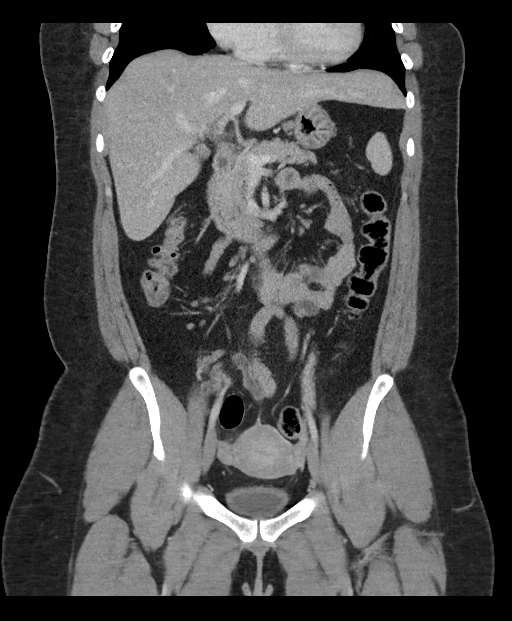

[16 of 46 positions shown; findings below may reference images not displayed]

RADIATION DOSE REDUCTION: This exam was performed according to the
departmental dose-optimization program which includes automated
exposure control, adjustment of the mA and/or kV according to
patient size and/or use of iterative reconstruction technique.

CONTRAST:  100mL OMNIPAQUE IOHEXOL 300 MG/ML  SOLN
FINDINGS: Lower Chest: No acute findings.

Hepatobiliary: Stable tiny sub-cm low-attenuation lesion in the
anterior right hepatic lobe which remains too small to characterize.
No other liver lesions identified. Gallbladder is unremarkable. No
evidence of biliary ductal dilatation.

Pancreas:  No mass or inflammatory changes.

Spleen: Within normal limits in size and appearance.

Adrenals/Urinary Tract: No masses identified. Stable mild right
renal parenchymal scarring. No evidence of ureteral calculi or
hydronephrosis.

Stomach/Bowel: No evidence of obstruction, inflammatory process or
abnormal fluid collections. Normal appendix visualized.

Vascular/Lymphatic: No pathologically enlarged lymph nodes. No acute
vascular findings.

Reproductive: No mass or other significant abnormality. Previously
seen small right ovarian cyst has resolved since prior study.

Other:  None.

Musculoskeletal:  No suspicious bone lesions identified.
IMPRESSION: No evidence of appendicitis or other acute findings.

Resolution of small right ovarian cyst since prior study.
